# Patient Record
Sex: Female | Born: 1955 | Race: Black or African American | Hispanic: No | Marital: Married | Smoking: Never smoker
Health system: Southern US, Community
[De-identification: ages and names within clinical notes are randomized; demographics above are authoritative.]

## PROBLEM LIST (undated history)

## (undated) DIAGNOSIS — I10 Essential (primary) hypertension: Secondary | ICD-10-CM

## (undated) DIAGNOSIS — K219 Gastro-esophageal reflux disease without esophagitis: Secondary | ICD-10-CM

## (undated) DIAGNOSIS — E785 Hyperlipidemia, unspecified: Secondary | ICD-10-CM

## (undated) HISTORY — PX: OTHER SURGICAL HISTORY: SHX169

## (undated) HISTORY — PX: SPINE SURGERY: SHX786

## (undated) HISTORY — DX: Essential (primary) hypertension: I10

## (undated) HISTORY — DX: Hyperlipidemia, unspecified: E78.5

## (undated) HISTORY — PX: ABDOMINAL HYSTERECTOMY: SHX81

## (undated) HISTORY — DX: Gastro-esophageal reflux disease without esophagitis: K21.9

---

## 2011-05-13 ENCOUNTER — Other Ambulatory Visit (HOSPITAL_COMMUNITY): Payer: Self-pay | Admitting: *Deleted

## 2011-05-13 DIAGNOSIS — Z1231 Encounter for screening mammogram for malignant neoplasm of breast: Secondary | ICD-10-CM

## 2011-06-09 ENCOUNTER — Ambulatory Visit (HOSPITAL_COMMUNITY)
Admission: RE | Admit: 2011-06-09 | Discharge: 2011-06-09 | Disposition: A | Discharge status: Home / Self Care | Payer: Self-pay | Source: Ambulatory Visit | Attending: Internal Medicine | Admitting: Internal Medicine

## 2011-06-09 DIAGNOSIS — Z1231 Encounter for screening mammogram for malignant neoplasm of breast: Secondary | ICD-10-CM

## 2012-03-22 ENCOUNTER — Other Ambulatory Visit (HOSPITAL_COMMUNITY)
Admission: RE | Admit: 2012-03-22 | Discharge: 2012-03-22 | Disposition: A | Discharge status: Home / Self Care | Payer: BC Managed Care – PPO | Source: Ambulatory Visit | Attending: Family Medicine | Admitting: Family Medicine

## 2012-03-22 ENCOUNTER — Other Ambulatory Visit: Payer: Self-pay | Admitting: Family Medicine

## 2012-03-22 DIAGNOSIS — Z124 Encounter for screening for malignant neoplasm of cervix: Secondary | ICD-10-CM | POA: Insufficient documentation

## 2012-03-22 DIAGNOSIS — Z1151 Encounter for screening for human papillomavirus (HPV): Secondary | ICD-10-CM | POA: Insufficient documentation

## 2012-03-23 ENCOUNTER — Other Ambulatory Visit: Payer: Self-pay | Admitting: Family Medicine

## 2012-03-23 DIAGNOSIS — E049 Nontoxic goiter, unspecified: Secondary | ICD-10-CM

## 2012-04-02 ENCOUNTER — Ambulatory Visit
Admission: RE | Admit: 2012-04-02 | Discharge: 2012-04-02 | Disposition: A | Discharge status: Home / Self Care | Payer: BC Managed Care – PPO | Source: Ambulatory Visit | Attending: Family Medicine | Admitting: Family Medicine

## 2012-04-02 DIAGNOSIS — E049 Nontoxic goiter, unspecified: Secondary | ICD-10-CM

## 2012-04-19 ENCOUNTER — Encounter (INDEPENDENT_AMBULATORY_CARE_PROVIDER_SITE_OTHER): Payer: Self-pay | Admitting: Surgery

## 2012-04-23 ENCOUNTER — Encounter (INDEPENDENT_AMBULATORY_CARE_PROVIDER_SITE_OTHER): Payer: Self-pay | Admitting: Surgery

## 2012-04-23 ENCOUNTER — Ambulatory Visit (INDEPENDENT_AMBULATORY_CARE_PROVIDER_SITE_OTHER): Payer: BC Managed Care – PPO | Admitting: Surgery

## 2012-04-23 VITALS — BP 120/78 | HR 78 | Resp 18 | Ht 62.0 in | Wt 143.0 lb

## 2012-04-23 DIAGNOSIS — E042 Nontoxic multinodular goiter: Secondary | ICD-10-CM | POA: Insufficient documentation

## 2012-04-23 NOTE — Patient Instructions (Signed)
Thyroid Biopsy The thyroid gland is a butterfly-shaped gland situated in the front of the neck. It produces hormones which affect metabolism, growth and development, and body temperature. A thyroid biopsy is a procedure in which small samples of tissue or fluid are removed from the thyroid gland or mass and examined under a microscope. This test is done to determine the cause of thyroid problems, such as infection, cancer, or other thyroid problems. There are 2 ways to obtain samples: 1. Fine needle biopsy. Samples are removed using a thin needle inserted through the skin and into the thyroid gland or mass. 2. Open biopsy. Samples are removed after a cut (incision) is made through the skin. LET YOUR CAREGIVER KNOW ABOUT:   Allergies.  Medications taken including herbs, eye drops, over-the-counter medications, and creams.  Use of steroids (by mouth or creams).  Previous problems with anesthetics or numbing medicine.  Possibility of pregnancy, if this applies.  History of blood clots (thrombophlebitis).  History of bleeding or blood problems.  Previous surgery.  Other health problems. RISKS AND COMPLICATIONS  Bleeding from the site. The risk of bleeding is higher if you have a bleeding disorder or are taking any blood thinning medications (anticoagulants).  Infection.  Injury to structures near the thyroid gland. BEFORE THE PROCEDURE  This is a procedure that can be done as an outpatient. Confirm the time that you need to arrive for your procedure. Confirm whether there is a need to fast or withhold any medications. A blood sample may be done to determine your blood clotting time. Medicine may be given to help you relax (sedative). PROCEDURE Fine needle biopsy. You will be awake during the procedure. You may be asked to lie on your back with your head tipped backward to extend your neck. Let your caregiver know if you cannot tolerate the positioning. An area on your neck will be  cleansed. A needle is inserted through the skin of your neck. You may feel a mild discomfort during this procedure. You may be asked to avoid coughing, talking, swallowing, or making sounds during some portions of the procedure. The needle is withdrawn once tissue or fluid samples have been removed. Pressure may be applied to the neck to reduce swelling and ensure that bleeding has stopped. The samples will be sent for examination.  Open biopsy. You will be given general anesthesia. You will be asleep during the procedure. An incision is made in your neck. A sample of thyroid tissue or the mass is removed. The tissue sample or mass will be sent for examination. The sample or mass may be examined during the biopsy. If the sample or mass contains cancer cells, some or all of the thyroid gland may be removed. The incision is closed with stitches. AFTER THE PROCEDURE  Your recovery will be assessed and monitored. If there are no problems, as an outpatient, you should be able to go home shortly after the procedure. If you had a fine needle biopsy:  You may have soreness at the biopsy site for 1 to 2 days. If you had an open biopsy:   You may have soreness at the biopsy site for 3 to 4 days.  You may have a hoarse voice or sore throat for 1 to 2 days. Obtaining the Test Results It is your responsibility to obtain your test results. Do not assume everything is normal if you have not heard from your caregiver or the medical facility. It is important for you to follow up   on all of your test results. HOME CARE INSTRUCTIONS   Keeping your head raised on a pillow when you are lying down may ease biopsy site discomfort.  Supporting the back of your head and neck with both hands as you sit up from a lying position may ease biopsy site discomfort.  Only take over-the-counter or prescription medicines for pain, discomfort, or fever as directed by your caregiver.  Throat lozenges or gargling with warm salt  water may help to soothe a sore throat. SEEK IMMEDIATE MEDICAL CARE IF:   You have severe bleeding from the biopsy site.  You have difficulty swallowing.  You have a fever.  You have increased pain, swelling, redness, or warmth at the biopsy site.  You notice pus coming from the biopsy site.  You have swollen glands (lymph nodes) in your neck. Document Released: 11/14/2006 Document Revised: 04/11/2011 Document Reviewed: 04/16/2008 ExitCare Patient Information 2013 [employer].  

## 2012-04-23 NOTE — Progress Notes (Signed)
General Surgery [facility]  Chief Complaint  Patient presents with  . New Evaluation    Multinodular thyroid gland - referral from Dr. Mila Palmer    HISTORY: Patient is a 57 year old black female who presents on referral from her primary care physician with multinodular thyroid. This had been initially diagnosed in 2010 in [place]. At that time she underwent fine needle aspiration biopsy. Surgery was not recommended. Patient was placed on thyroid hormone supplements. Patient relocated to [facility] approximately 2 years ago. She stopped taking her thyroid hormone at that time.  Patient had thyroid ultrasound performed on 04/02/2012. This shows a normal sized thyroid gland containing multiple thyroid nodules. There is a dominant nodule in the left side of the thyroid isthmus measuring 3.1 cm in greatest dimension. It is heterogeneous. It may contain microcalcifications.  Patient denies any family history of other endocrine tumor such as pituitary tumors or adrenal tumors. She does have sisters with thyroid nodules. None of these individuals have had surgery. There is no family history of thyroid cancer.  Past Medical History  Diagnosis Date  . Hypertension   . Hyperlipidemia   . GERD (gastroesophageal reflux disease)      Current Outpatient Prescriptions  Medication Sig Dispense Refill  . amLODipine (NORVASC) 5 MG tablet Take 5 mg by mouth daily.      Marland Kitchen esomeprazole (NEXIUM) 40 MG capsule Take 40 mg by mouth daily before breakfast.      . losartan-hydrochlorothiazide (HYZAAR) 100-25 MG per tablet Take 1 tablet by mouth daily.      . simvastatin (ZOCOR) 20 MG tablet Take 20 mg by mouth every evening.       No current facility-administered medications for this visit.     Allergies  Allergen Reactions  . Penicillins Swelling     Family History  Problem Relation Age of Onset  . Heart disease Mother   . Stroke Father   . Hypertension Sister       History   Social History  . Marital Status: Married    Spouse Name: N/A    Number of Children: N/A  . Years of Education: N/A   Social History Main Topics  . Smoking status: Never Smoker   . Smokeless tobacco: None  . Alcohol Use: No  . Drug Use: No  . Sexually Active: None   Other Topics Concern  . None   Social History Narrative  . None     REVIEW OF SYSTEMS - PERTINENT POSITIVES ONLY: Denies tremor. Denies palpitations. Denies compressive symptoms. Denies pain.  EXAM: Filed Vitals:   04/23/12 1346  BP: 120/78  Pulse: 78  Resp: 18    HEENT: normocephalic; pupils equal and reactive; sclerae clear; dentition good; mucous membranes moist NECK:  Obvious dominant nodule just to the left of midline in the thyroid isthmus; palpation reveals approximately a 2.5 cm mobile nodule which is nontender; right and left lobes are without other dominant mass; asymmetric on extension; no palpable anterior or posterior cervical lymphadenopathy; no supraclavicular masses; no tenderness CHEST: clear to auscultation bilaterally without rales, rhonchi, or wheezes CARDIAC: regular rate and rhythm without significant murmur; peripheral pulses are full EXT:  non-tender without edema; no deformity NEURO: no gross focal deficits; no sign of tremor   LABORATORY RESULTS: See [facility] (CHL-Epic) for most recent results   RADIOLOGY RESULTS: See [facility] (CHL-Epic) for most recent results   IMPRESSION: #1 dominant thyroid nodule, 3.1 cm, arising in the isthmus #  2 multinodular thyroid gland #3 history of thyroid hormone supplementation  PLAN: The patient and I discussed the above findings at length. I provided her with written literature to review. I have recommended that she undergo a repeat fine-needle aspiration biopsy with ultrasound guidance. We will make arrangements for this study in the near future.  We will obtain a TSH level from her primary care physician if  one was obtained.  I will contact the patient with the results of her fine-needle aspiration biopsy once they are available. If this is indeed a benign finding, then we will make arrangements for 6 month followup thyroid ultrasound and TSH level and physical examination. If there is any evidence of malignancy, we will discuss thyroidectomy.  Velora Heckler, MD, FACS General & Endocrine Surgery [facility]   Visit Diagnoses: 1. Multiple thyroid nodules     Primary Care Physician: Emeterio Reeve, MD

## 2012-04-25 ENCOUNTER — Ambulatory Visit
Admission: RE | Admit: 2012-04-25 | Discharge: 2012-04-25 | Disposition: A | Discharge status: Home / Self Care | Payer: BC Managed Care – PPO | Source: Ambulatory Visit | Attending: Surgery | Admitting: Surgery

## 2012-04-25 ENCOUNTER — Other Ambulatory Visit (HOSPITAL_COMMUNITY)
Admission: RE | Admit: 2012-04-25 | Discharge: 2012-04-25 | Disposition: A | Discharge status: Home / Self Care | Payer: BC Managed Care – PPO | Source: Ambulatory Visit | Attending: Interventional Radiology | Admitting: Interventional Radiology

## 2012-04-25 DIAGNOSIS — E042 Nontoxic multinodular goiter: Secondary | ICD-10-CM

## 2012-04-25 DIAGNOSIS — E041 Nontoxic single thyroid nodule: Secondary | ICD-10-CM | POA: Insufficient documentation

## 2012-04-26 NOTE — Progress Notes (Signed)
Quick Note:  Please contact patient and notify of benign pathology results.  Augusta Mirkin M. Advith Martine, MD, FACS [facility] Office: [phone]   ______ 

## 2012-05-09 ENCOUNTER — Telehealth (INDEPENDENT_AMBULATORY_CARE_PROVIDER_SITE_OTHER): Payer: Self-pay

## 2012-05-09 NOTE — Telephone Encounter (Signed)
Labs here and to Dr Gerrit Friends to review.

## 2012-05-17 ENCOUNTER — Telehealth (INDEPENDENT_AMBULATORY_CARE_PROVIDER_SITE_OTHER): Payer: Self-pay

## 2012-05-17 NOTE — Telephone Encounter (Signed)
Message copied by Joanette Gula on Thu May 17, 2012  2:06 PM ------      Message from: Parks Neptune      Created: Thu May 17, 2012  1:37 PM      Regarding: Synthroid Meds      Contact: [phone]       Will I need the synthroid meds or not?  DF ------

## 2012-05-24 ENCOUNTER — Encounter (INDEPENDENT_AMBULATORY_CARE_PROVIDER_SITE_OTHER): Payer: Self-pay

## 2012-05-25 ENCOUNTER — Encounter (INDEPENDENT_AMBULATORY_CARE_PROVIDER_SITE_OTHER): Payer: Self-pay

## 2012-07-13 ENCOUNTER — Other Ambulatory Visit (HOSPITAL_COMMUNITY): Payer: Self-pay | Admitting: Family Medicine

## 2012-07-13 DIAGNOSIS — Z1231 Encounter for screening mammogram for malignant neoplasm of breast: Secondary | ICD-10-CM

## 2012-07-19 ENCOUNTER — Ambulatory Visit (HOSPITAL_COMMUNITY)
Admission: RE | Admit: 2012-07-19 | Discharge: 2012-07-19 | Disposition: A | Discharge status: Home / Self Care | Payer: BC Managed Care – PPO | Source: Ambulatory Visit | Attending: Family Medicine | Admitting: Family Medicine

## 2012-07-19 DIAGNOSIS — Z1231 Encounter for screening mammogram for malignant neoplasm of breast: Secondary | ICD-10-CM | POA: Insufficient documentation

## 2012-07-24 ENCOUNTER — Other Ambulatory Visit: Payer: Self-pay | Admitting: Family Medicine

## 2012-07-24 DIAGNOSIS — R928 Other abnormal and inconclusive findings on diagnostic imaging of breast: Secondary | ICD-10-CM

## 2012-08-07 ENCOUNTER — Ambulatory Visit
Admission: RE | Admit: 2012-08-07 | Discharge: 2012-08-07 | Disposition: A | Discharge status: Home / Self Care | Payer: BC Managed Care – PPO | Source: Ambulatory Visit | Attending: Family Medicine | Admitting: Family Medicine

## 2012-08-07 DIAGNOSIS — R928 Other abnormal and inconclusive findings on diagnostic imaging of breast: Secondary | ICD-10-CM

## 2012-11-12 ENCOUNTER — Encounter (INDEPENDENT_AMBULATORY_CARE_PROVIDER_SITE_OTHER): Payer: Self-pay | Admitting: Surgery

## 2012-11-12 ENCOUNTER — Ambulatory Visit (INDEPENDENT_AMBULATORY_CARE_PROVIDER_SITE_OTHER): Payer: BC Managed Care – PPO | Admitting: Surgery

## 2012-11-12 VITALS — BP 122/82 | HR 72 | Temp 98.2°F | Resp 14 | Ht 61.25 in | Wt 144.6 lb

## 2012-11-12 DIAGNOSIS — E042 Nontoxic multinodular goiter: Secondary | ICD-10-CM

## 2012-11-12 DIAGNOSIS — E041 Nontoxic single thyroid nodule: Secondary | ICD-10-CM

## 2012-11-12 NOTE — Progress Notes (Signed)
General Surgery [facility]  Chief Complaint  Patient presents with  . Follow-up    thyroid nodules    HISTORY: Patient is a 57 year old female followed for multiple thyroid nodules. She was last evaluated in March 2014. She has a dominant nodule measuring 3.1 cm in the thyroid isthmus. Fine needle aspiration biopsy showed benign follicular cells. TSH level in January 2014 was normal at 0.65. Patient is not on thyroid hormone.  PERTINENT REVIEW OF SYSTEMS: Patient denies tremor. Denies palpitations. Denies compressive symptoms. Patient does not drink dominant nodule has changed in size since her last examination.  EXAM: HEENT: normocephalic; pupils equal and reactive; sclerae clear; dentition good; mucous membranes moist NECK:  Palpable 3 cm soft mobile mass in the thyroid isthmus; left and right thyroid lobes without palpable dominant nodules; asymmetric on extension; no palpable anterior or posterior cervical lymphadenopathy; no supraclavicular masses; no tenderness CHEST: clear to auscultation bilaterally without rales, rhonchi, or wheezes CARDIAC: regular rate and rhythm without significant murmur; peripheral pulses are full EXT:  non-tender without edema; no deformity NEURO: no gross focal deficits; no sign of tremor   IMPRESSION: Multiple thyroid nodules, benign cytopathology, clinically stable  PLAN: Patient and I discussed her biopsy result and her TSH level. We will repeat her thyroid ultrasound in March 2015. She will have a TSH level drawn by her primary care physician at her annual visit. I plan to see her back for physical examination in April 2015.  Velora Heckler, MD, [facility] Office: [phone]  Visit Diagnoses: 1. Thyroid nodule   2. Multiple thyroid nodules

## 2012-11-14 ENCOUNTER — Ambulatory Visit (INDEPENDENT_AMBULATORY_CARE_PROVIDER_SITE_OTHER): Payer: BC Managed Care – PPO | Admitting: Surgery

## 2013-04-08 ENCOUNTER — Ambulatory Visit
Admission: RE | Admit: 2013-04-08 | Discharge: 2013-04-08 | Disposition: A | Discharge status: Home / Self Care | Payer: 59 | Source: Ambulatory Visit | Attending: Surgery | Admitting: Surgery

## 2013-04-08 DIAGNOSIS — E041 Nontoxic single thyroid nodule: Secondary | ICD-10-CM

## 2013-04-09 ENCOUNTER — Telehealth (INDEPENDENT_AMBULATORY_CARE_PROVIDER_SITE_OTHER): Payer: Self-pay

## 2013-04-09 NOTE — Telephone Encounter (Signed)
Recent U/S in epic. Will send msg to Dr Gerrit FriendsGerkin to review and advise f/u.

## 2013-04-16 NOTE — Telephone Encounter (Signed)
Discussed ultrasound results with patient.  Will cancel April appointment.  Plan repeat ultrasound and TSH level in one year.  Will see in office after those studies.  Velora Hecklerodd M. Syriah Delisi, MD, [facility] Office: [phone]

## 2013-05-20 ENCOUNTER — Ambulatory Visit (INDEPENDENT_AMBULATORY_CARE_PROVIDER_SITE_OTHER): Payer: BC Managed Care – PPO | Admitting: Surgery

## 2013-07-25 ENCOUNTER — Other Ambulatory Visit: Payer: Self-pay

## 2013-07-25 DIAGNOSIS — Z1231 Encounter for screening mammogram for malignant neoplasm of breast: Secondary | ICD-10-CM

## 2013-08-05 ENCOUNTER — Ambulatory Visit: Payer: 59

## 2013-09-02 ENCOUNTER — Ambulatory Visit: Payer: 59

## 2013-09-23 ENCOUNTER — Ambulatory Visit: Payer: 59

## 2013-10-21 ENCOUNTER — Ambulatory Visit: Payer: 59

## 2013-11-18 ENCOUNTER — Ambulatory Visit: Payer: 59

## 2014-01-01 ENCOUNTER — Ambulatory Visit
Admission: RE | Admit: 2014-01-01 | Discharge: 2014-01-01 | Disposition: A | Discharge status: Home / Self Care | Payer: 59 | Source: Ambulatory Visit

## 2014-01-01 DIAGNOSIS — Z1231 Encounter for screening mammogram for malignant neoplasm of breast: Secondary | ICD-10-CM

## 2014-03-10 ENCOUNTER — Other Ambulatory Visit (INDEPENDENT_AMBULATORY_CARE_PROVIDER_SITE_OTHER): Payer: Self-pay | Admitting: Surgery

## 2014-03-17 ENCOUNTER — Other Ambulatory Visit (INDEPENDENT_AMBULATORY_CARE_PROVIDER_SITE_OTHER): Payer: Self-pay | Admitting: *Deleted

## 2014-03-17 DIAGNOSIS — E042 Nontoxic multinodular goiter: Secondary | ICD-10-CM

## 2014-03-19 ENCOUNTER — Ambulatory Visit
Admission: RE | Admit: 2014-03-19 | Discharge: 2014-03-19 | Disposition: A | Discharge status: Home / Self Care | Payer: 59 | Source: Ambulatory Visit | Attending: Surgery | Admitting: Surgery

## 2014-08-03 IMAGING — US US SOFT TISSUE HEAD/NECK
1 series · 13 of 25 positions shown · non-contrast
Comparison: The patient describes an ultrasound in [place] [facility] form
several years ago.  The reports are not available.

CLINICAL DATA: Thyroid goiter

THYROID ULTRASOUND
TECHNIQUE: Ultrasound examination of the thyroid gland and adjacent
soft tissues was performed.

[Series 1: us soft tissue head/neck · 0.09mm/px · 13 of 68 slices shown]
[im 1/68]
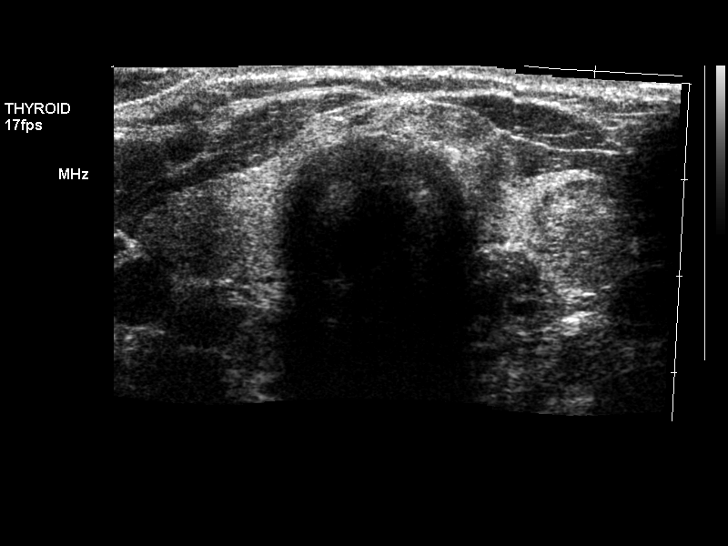
[im 6/68]
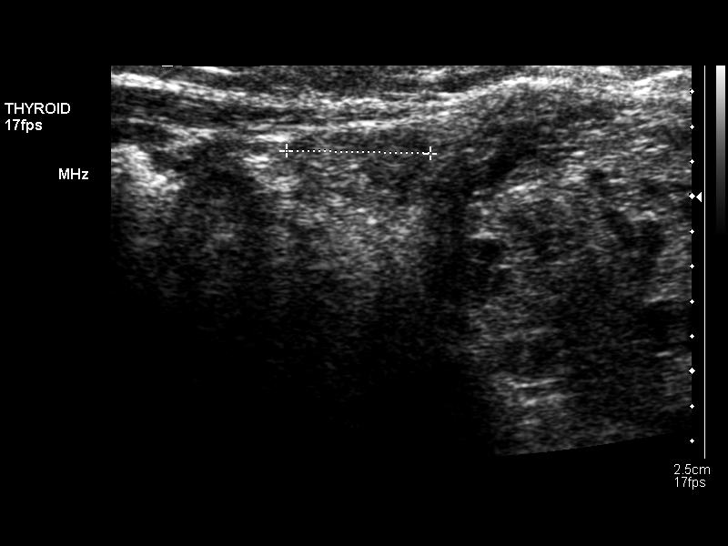
[im 12/68]
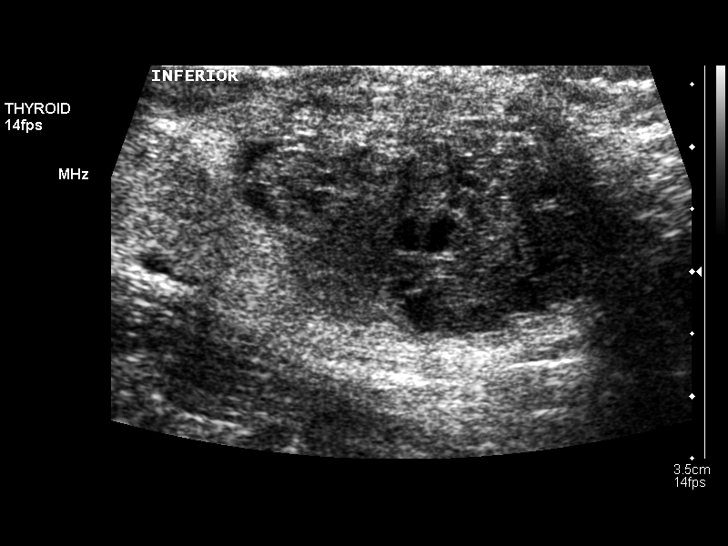
[im 17/68]
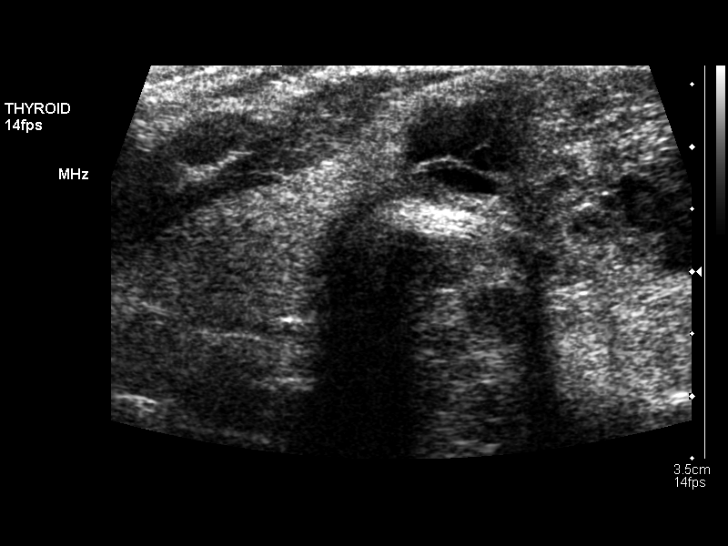
[im 23/68]
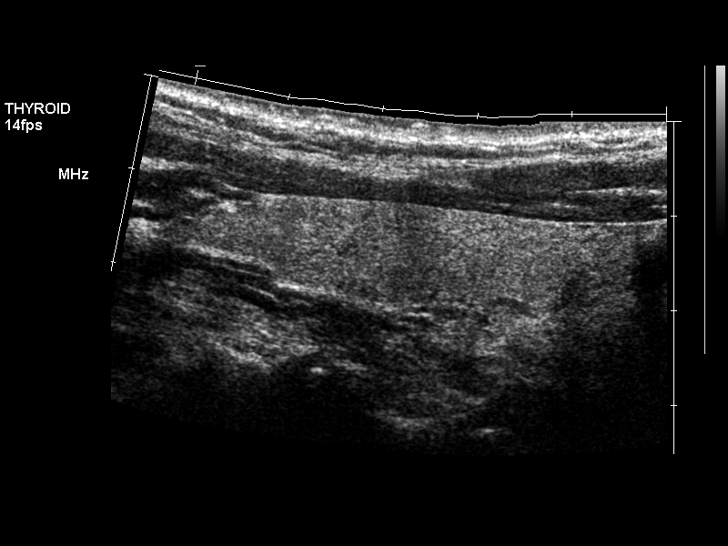
[im 28/68]
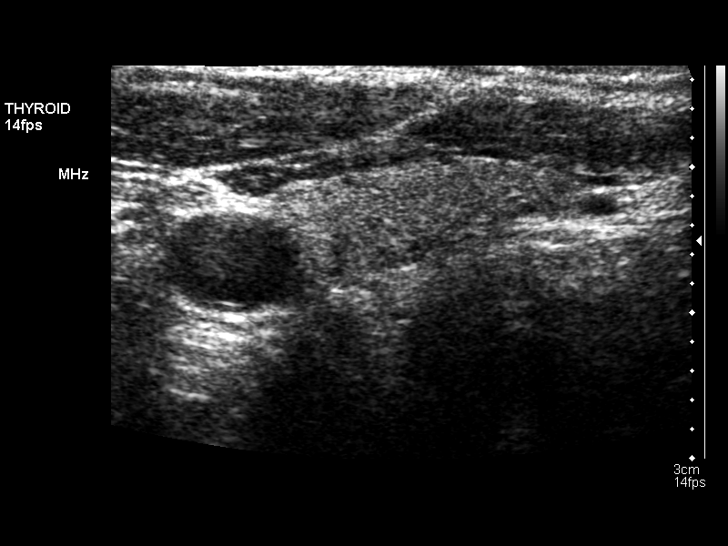
[im 34/68]
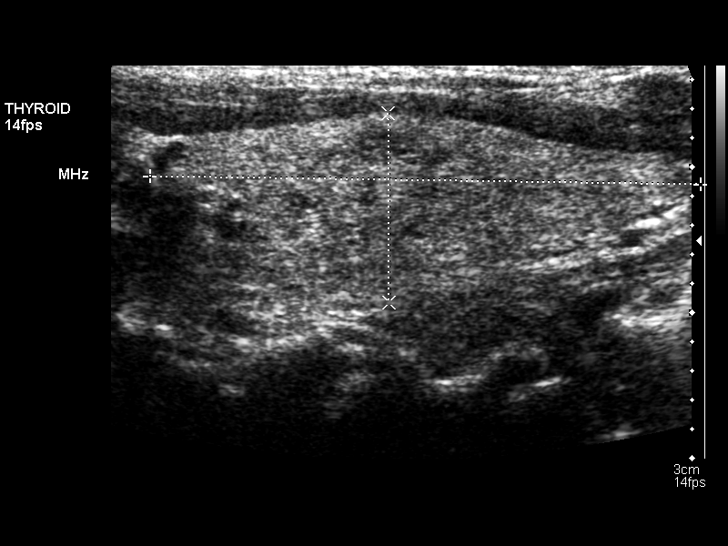
[im 40/68]
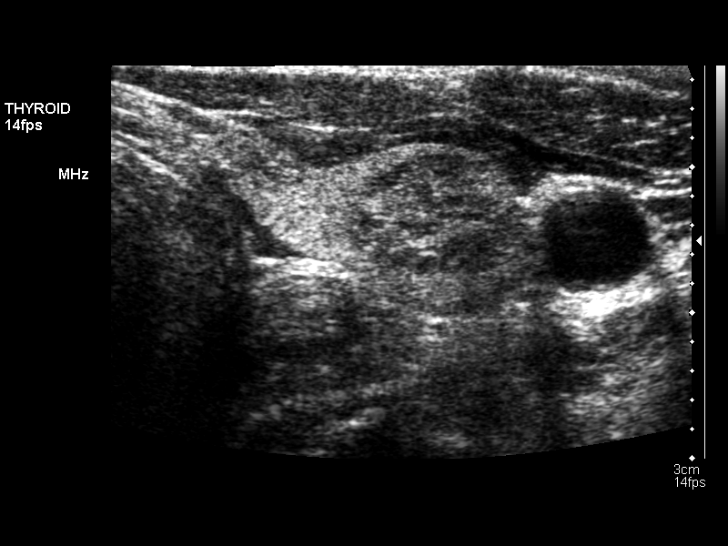
[im 45/68]
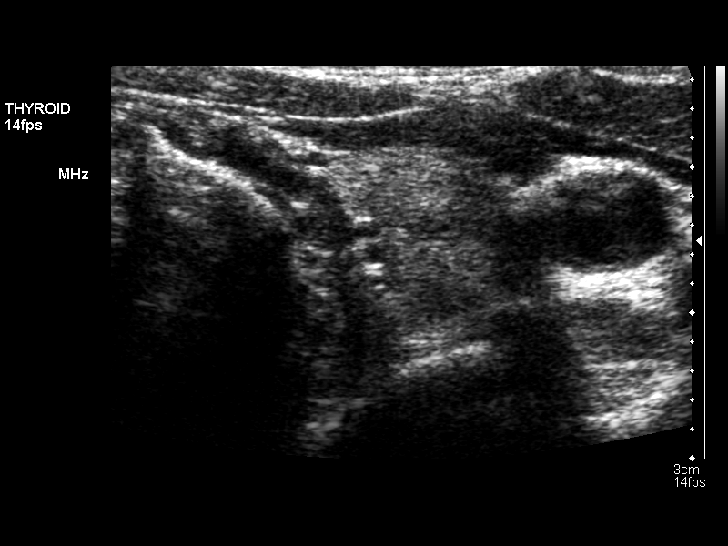
[im 51/68]
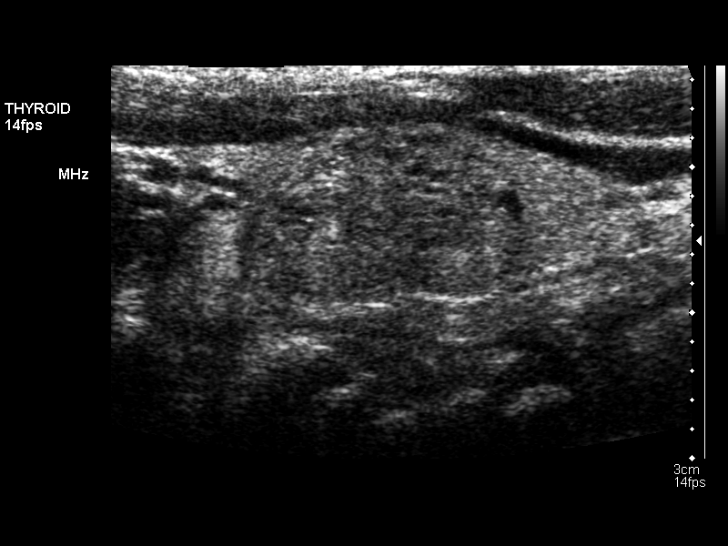
[im 56/68]
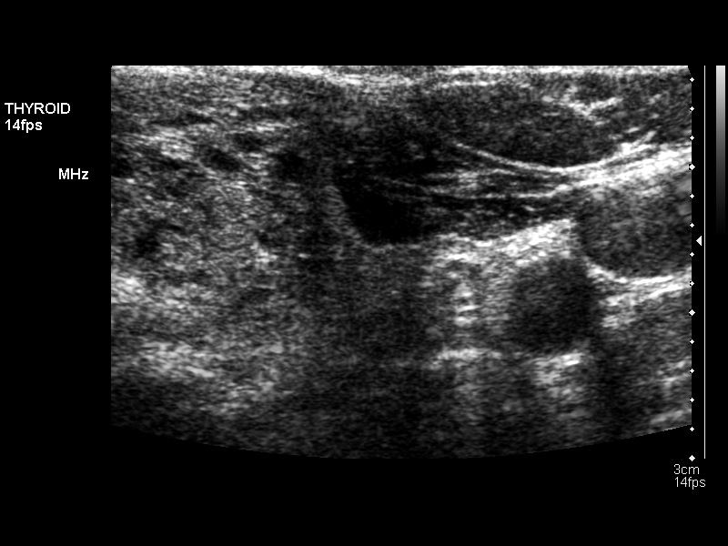
[im 62/68]
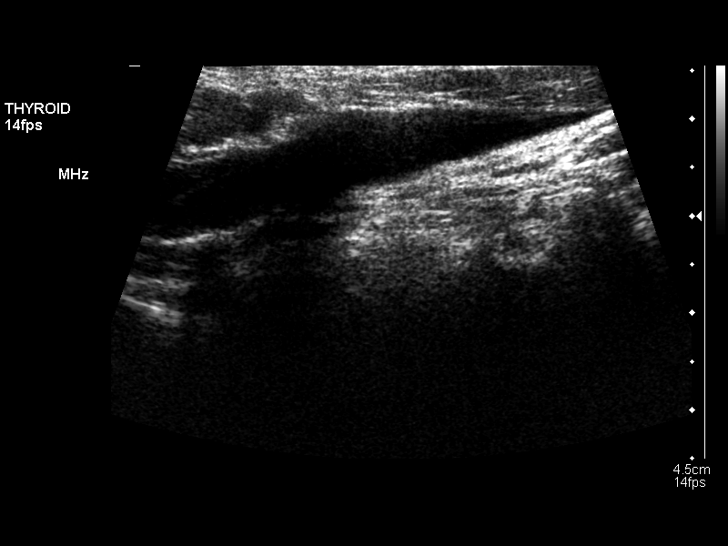
[im 68/68]
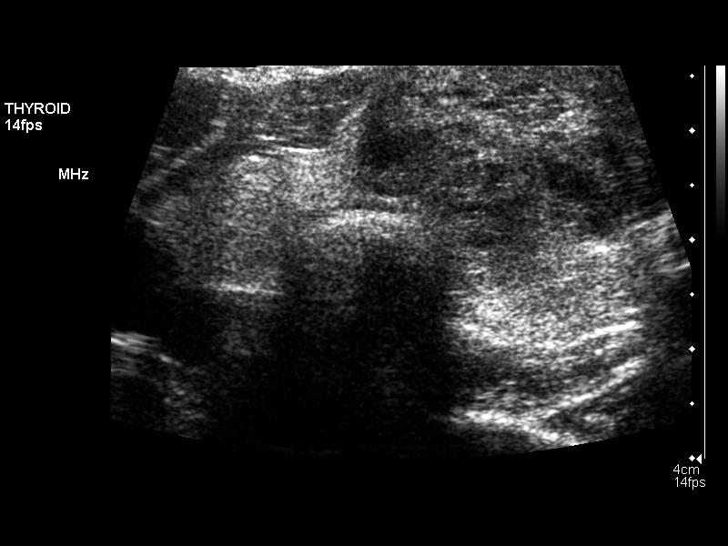

[13 of 25 positions shown; findings below may reference images not displayed]

FINDINGS: Right thyroid lobe:  4.8 x 1.2 x 1.4 cm.
Left thyroid lobe:  3.8 x 1.3 x 2.0 cm.
Isthmus:  4.4 mm

Focal nodules:  Several nodules are present.  The largest is along
the left side of the isthmus, measuring 2.7 x 2.2 x 3.1 cm.  This
is a solid nodule with significant heterogeneity.  Some
microcalcifications are present.

A smaller nodule on the right side of the isthmus measures 1.0 x
0.8 x 0.7 cm.

Four nodules in the left lobe of the thyroid range from 0.5 cm to
1.5 cm.

Lymphadenopathy:  A benign-appearing lymph node in the left neck
measures 3.5 mm in short axis.  No pathologic nodes are evident.
IMPRESSION: Multinodular goiter.  The largest nodule is within the isthmus,
measuring 2.7 x 2.2 x 3.1 cm.  This may contain some internal
calcifications. Findings meet consensus criteria for biopsy.
Ultrasound-guided fine needle aspiration should be considered, as
per the consensus statement: Management of Thyroid Nodules Detected
at US:  Society of Radiologists in Ultrasound Consensus Conference

## 2014-11-27 ENCOUNTER — Other Ambulatory Visit: Payer: Self-pay

## 2014-11-27 DIAGNOSIS — Z1231 Encounter for screening mammogram for malignant neoplasm of breast: Secondary | ICD-10-CM

## 2015-01-09 ENCOUNTER — Ambulatory Visit: Payer: 59

## 2015-02-17 ENCOUNTER — Other Ambulatory Visit: Payer: Self-pay | Admitting: Surgery

## 2015-02-17 DIAGNOSIS — E042 Nontoxic multinodular goiter: Secondary | ICD-10-CM

## 2015-03-13 ENCOUNTER — Ambulatory Visit
Admission: RE | Admit: 2015-03-13 | Discharge: 2015-03-13 | Disposition: A | Discharge status: Home / Self Care | Payer: 59 | Source: Ambulatory Visit

## 2015-03-13 DIAGNOSIS — Z1231 Encounter for screening mammogram for malignant neoplasm of breast: Secondary | ICD-10-CM

## 2015-05-26 ENCOUNTER — Ambulatory Visit
Admission: RE | Admit: 2015-05-26 | Discharge: 2015-05-26 | Disposition: A | Discharge status: Home / Self Care | Payer: 59 | Source: Ambulatory Visit | Attending: Surgery | Admitting: Surgery

## 2015-05-26 DIAGNOSIS — E042 Nontoxic multinodular goiter: Secondary | ICD-10-CM

## 2016-04-06 ENCOUNTER — Other Ambulatory Visit: Payer: Self-pay | Admitting: Surgery

## 2016-04-06 DIAGNOSIS — E041 Nontoxic single thyroid nodule: Secondary | ICD-10-CM

## 2016-04-21 ENCOUNTER — Ambulatory Visit
Admission: RE | Admit: 2016-04-21 | Discharge: 2016-04-21 | Disposition: A | Discharge status: Home / Self Care | Payer: 59 | Source: Ambulatory Visit | Attending: Surgery | Admitting: Surgery

## 2016-04-21 DIAGNOSIS — E041 Nontoxic single thyroid nodule: Secondary | ICD-10-CM

## 2017-04-12 ENCOUNTER — Other Ambulatory Visit: Payer: Self-pay | Admitting: Surgery

## 2017-04-12 DIAGNOSIS — E042 Nontoxic multinodular goiter: Secondary | ICD-10-CM

## 2017-05-22 ENCOUNTER — Other Ambulatory Visit: Payer: Self-pay | Admitting: Family Medicine

## 2017-05-22 DIAGNOSIS — Z139 Encounter for screening, unspecified: Secondary | ICD-10-CM

## 2017-06-12 ENCOUNTER — Ambulatory Visit
Admission: RE | Admit: 2017-06-12 | Discharge: 2017-06-12 | Disposition: A | Discharge status: Home / Self Care | Payer: 59 | Source: Ambulatory Visit | Attending: Surgery | Admitting: Surgery

## 2017-06-12 DIAGNOSIS — E042 Nontoxic multinodular goiter: Secondary | ICD-10-CM

## 2017-06-13 ENCOUNTER — Ambulatory Visit
Admission: RE | Admit: 2017-06-13 | Discharge: 2017-06-13 | Disposition: A | Discharge status: Home / Self Care | Payer: 59 | Source: Ambulatory Visit | Attending: Family Medicine | Admitting: Family Medicine

## 2017-06-13 DIAGNOSIS — Z139 Encounter for screening, unspecified: Secondary | ICD-10-CM

## 2017-12-11 DIAGNOSIS — E876 Hypokalemia: Secondary | ICD-10-CM | POA: Diagnosis not present

## 2018-01-08 DIAGNOSIS — M545 Low back pain: Secondary | ICD-10-CM | POA: Diagnosis not present

## 2018-01-08 DIAGNOSIS — M7501 Adhesive capsulitis of right shoulder: Secondary | ICD-10-CM | POA: Diagnosis not present

## 2018-01-09 DIAGNOSIS — E876 Hypokalemia: Secondary | ICD-10-CM | POA: Diagnosis not present

## 2018-02-09 DIAGNOSIS — R69 Illness, unspecified: Secondary | ICD-10-CM | POA: Diagnosis not present

## 2018-02-09 DIAGNOSIS — R509 Fever, unspecified: Secondary | ICD-10-CM | POA: Diagnosis not present

## 2018-02-26 DIAGNOSIS — E7800 Pure hypercholesterolemia, unspecified: Secondary | ICD-10-CM | POA: Diagnosis not present

## 2018-02-26 DIAGNOSIS — I10 Essential (primary) hypertension: Secondary | ICD-10-CM | POA: Diagnosis not present

## 2018-02-26 DIAGNOSIS — E049 Nontoxic goiter, unspecified: Secondary | ICD-10-CM | POA: Diagnosis not present

## 2018-02-26 DIAGNOSIS — R222 Localized swelling, mass and lump, trunk: Secondary | ICD-10-CM | POA: Diagnosis not present

## 2018-03-01 ENCOUNTER — Other Ambulatory Visit: Payer: Self-pay | Admitting: Family Medicine

## 2018-03-01 DIAGNOSIS — R222 Localized swelling, mass and lump, trunk: Secondary | ICD-10-CM

## 2018-03-06 ENCOUNTER — Ambulatory Visit
Admission: RE | Admit: 2018-03-06 | Discharge: 2018-03-06 | Disposition: A | Discharge status: Home / Self Care | Payer: Self-pay | Source: Ambulatory Visit | Attending: Family Medicine | Admitting: Family Medicine

## 2018-03-06 DIAGNOSIS — R222 Localized swelling, mass and lump, trunk: Secondary | ICD-10-CM

## 2018-03-06 DIAGNOSIS — R221 Localized swelling, mass and lump, neck: Secondary | ICD-10-CM | POA: Diagnosis not present

## 2018-03-12 DIAGNOSIS — E049 Nontoxic goiter, unspecified: Secondary | ICD-10-CM | POA: Diagnosis not present

## 2018-03-12 DIAGNOSIS — E7800 Pure hypercholesterolemia, unspecified: Secondary | ICD-10-CM | POA: Diagnosis not present

## 2018-03-12 DIAGNOSIS — Z79899 Other long term (current) drug therapy: Secondary | ICD-10-CM | POA: Diagnosis not present

## 2018-05-28 ENCOUNTER — Other Ambulatory Visit: Payer: Self-pay | Admitting: Family Medicine

## 2018-05-28 DIAGNOSIS — Z1231 Encounter for screening mammogram for malignant neoplasm of breast: Secondary | ICD-10-CM

## 2018-06-08 ENCOUNTER — Other Ambulatory Visit: Payer: Self-pay | Admitting: Surgery

## 2018-06-08 DIAGNOSIS — E042 Nontoxic multinodular goiter: Secondary | ICD-10-CM

## 2018-06-11 DIAGNOSIS — Z1211 Encounter for screening for malignant neoplasm of colon: Secondary | ICD-10-CM | POA: Diagnosis not present

## 2018-07-04 ENCOUNTER — Other Ambulatory Visit: Payer: Self-pay

## 2018-07-04 ENCOUNTER — Ambulatory Visit
Admission: RE | Admit: 2018-07-04 | Discharge: 2018-07-04 | Disposition: A | Discharge status: Home / Self Care | Payer: BLUE CROSS/BLUE SHIELD | Source: Ambulatory Visit | Attending: Surgery | Admitting: Surgery

## 2018-07-04 DIAGNOSIS — E042 Nontoxic multinodular goiter: Secondary | ICD-10-CM

## 2018-07-04 DIAGNOSIS — E041 Nontoxic single thyroid nodule: Secondary | ICD-10-CM | POA: Diagnosis not present

## 2018-07-10 DIAGNOSIS — E042 Nontoxic multinodular goiter: Secondary | ICD-10-CM | POA: Diagnosis not present

## 2018-07-23 ENCOUNTER — Ambulatory Visit: Payer: BLUE CROSS/BLUE SHIELD

## 2018-09-03 ENCOUNTER — Other Ambulatory Visit: Payer: Self-pay

## 2018-09-03 ENCOUNTER — Ambulatory Visit
Admission: RE | Admit: 2018-09-03 | Discharge: 2018-09-03 | Disposition: A | Discharge status: Home / Self Care | Payer: BC Managed Care – PPO | Source: Ambulatory Visit | Attending: Family Medicine | Admitting: Family Medicine

## 2018-09-03 DIAGNOSIS — Z1231 Encounter for screening mammogram for malignant neoplasm of breast: Secondary | ICD-10-CM | POA: Diagnosis not present

## 2018-10-05 ENCOUNTER — Other Ambulatory Visit: Payer: Self-pay

## 2018-10-05 DIAGNOSIS — Z20822 Contact with and (suspected) exposure to covid-19: Secondary | ICD-10-CM

## 2018-10-07 LAB — NOVEL CORONAVIRUS, NAA: SARS-CoV-2, NAA: NOT DETECTED

## 2019-02-25 DIAGNOSIS — Z1211 Encounter for screening for malignant neoplasm of colon: Secondary | ICD-10-CM | POA: Diagnosis not present

## 2019-02-25 DIAGNOSIS — K219 Gastro-esophageal reflux disease without esophagitis: Secondary | ICD-10-CM | POA: Diagnosis not present

## 2019-02-25 DIAGNOSIS — I10 Essential (primary) hypertension: Secondary | ICD-10-CM | POA: Diagnosis not present

## 2019-02-25 DIAGNOSIS — E559 Vitamin D deficiency, unspecified: Secondary | ICD-10-CM | POA: Diagnosis not present

## 2019-02-25 DIAGNOSIS — Z79899 Other long term (current) drug therapy: Secondary | ICD-10-CM | POA: Diagnosis not present

## 2019-02-25 DIAGNOSIS — Z0000 Encounter for general adult medical examination without abnormal findings: Secondary | ICD-10-CM | POA: Diagnosis not present

## 2019-02-25 DIAGNOSIS — Z8639 Personal history of other endocrine, nutritional and metabolic disease: Secondary | ICD-10-CM | POA: Diagnosis not present

## 2019-02-25 DIAGNOSIS — E049 Nontoxic goiter, unspecified: Secondary | ICD-10-CM | POA: Diagnosis not present

## 2019-02-25 DIAGNOSIS — F330 Major depressive disorder, recurrent, mild: Secondary | ICD-10-CM | POA: Diagnosis not present

## 2019-02-25 DIAGNOSIS — E7800 Pure hypercholesterolemia, unspecified: Secondary | ICD-10-CM | POA: Diagnosis not present

## 2019-02-25 DIAGNOSIS — Z23 Encounter for immunization: Secondary | ICD-10-CM | POA: Diagnosis not present

## 2019-03-29 ENCOUNTER — Ambulatory Visit: Payer: Self-pay

## 2019-04-01 ENCOUNTER — Ambulatory Visit: Payer: PPO | Attending: Internal Medicine

## 2019-04-01 DIAGNOSIS — Z23 Encounter for immunization: Secondary | ICD-10-CM | POA: Insufficient documentation

## 2019-04-01 NOTE — Progress Notes (Signed)
   Covid-19 Vaccination Clinic  Name:  ATLANTA PELTO    MRN: [id number] DOB: 1955-09-04  04/01/2019  Ms. Marich was observed post Covid-19 immunization for 15 minutes without incidence. She was provided with Vaccine Information Sheet and instruction to access the V-Safe system.   Ms. Yamin was instructed to call 911 with any severe reactions post vaccine: Marland Kitchen Difficulty breathing  . Swelling of your face and throat  . A fast heartbeat  . A bad rash all over your body  . Dizziness and weakness    Immunizations Administered    Name Date Dose VIS Date Route   Pfizer COVID-19 Vaccine 04/01/2019 10:07 AM 0.3 mL 01/11/2019 Intramuscular   Manufacturer: [employer], [employer]   Lot: [id number]   NDC: [address]-3

## 2019-04-02 DIAGNOSIS — R232 Flushing: Secondary | ICD-10-CM | POA: Diagnosis not present

## 2019-04-26 DIAGNOSIS — K625 Hemorrhage of anus and rectum: Secondary | ICD-10-CM | POA: Diagnosis not present

## 2019-04-30 ENCOUNTER — Ambulatory Visit: Payer: PPO | Attending: Internal Medicine

## 2019-04-30 DIAGNOSIS — Z23 Encounter for immunization: Secondary | ICD-10-CM

## 2019-04-30 NOTE — Progress Notes (Signed)
   Covid-19 Vaccination Clinic  Name:  Shelby Woodward    MRN: [id number] DOB: 16-Mar-1955  04/30/2019  Ms. Shelby Woodward was observed post Covid-19 immunization for 15 minutes without incident. She was provided with Vaccine Information Sheet and instruction to access the V-Safe system.   Ms. Shelby Woodward was instructed to call 911 with any severe reactions post vaccine: Marland Kitchen Difficulty breathing  . Swelling of face and throat  . A fast heartbeat  . A bad rash all over body  . Dizziness and weakness   Immunizations Administered    Name Date Dose VIS Date Route   Pfizer COVID-19 Vaccine 04/30/2019 10:47 AM 0.3 mL 01/11/2019 Intramuscular   Manufacturer: [employer], [employer]   Lot: [id number]   NDC: [address]-3

## 2019-06-04 ENCOUNTER — Other Ambulatory Visit: Payer: Self-pay | Admitting: Surgery

## 2019-06-04 DIAGNOSIS — E042 Nontoxic multinodular goiter: Secondary | ICD-10-CM

## 2019-06-10 ENCOUNTER — Other Ambulatory Visit: Payer: Self-pay | Admitting: Family Medicine

## 2019-06-10 DIAGNOSIS — E2839 Other primary ovarian failure: Secondary | ICD-10-CM

## 2019-06-12 DIAGNOSIS — Z1211 Encounter for screening for malignant neoplasm of colon: Secondary | ICD-10-CM | POA: Diagnosis not present

## 2019-06-24 ENCOUNTER — Ambulatory Visit
Admission: RE | Admit: 2019-06-24 | Discharge: 2019-06-24 | Disposition: A | Discharge status: Home / Self Care | Payer: PPO | Source: Ambulatory Visit | Attending: Surgery | Admitting: Surgery

## 2019-06-24 DIAGNOSIS — E042 Nontoxic multinodular goiter: Secondary | ICD-10-CM

## 2019-06-24 DIAGNOSIS — E041 Nontoxic single thyroid nodule: Secondary | ICD-10-CM | POA: Diagnosis not present

## 2019-07-25 DIAGNOSIS — S83242A Other tear of medial meniscus, current injury, left knee, initial encounter: Secondary | ICD-10-CM | POA: Diagnosis not present

## 2019-07-25 DIAGNOSIS — M7502 Adhesive capsulitis of left shoulder: Secondary | ICD-10-CM | POA: Diagnosis not present

## 2019-08-14 ENCOUNTER — Other Ambulatory Visit: Payer: Self-pay | Admitting: Family Medicine

## 2019-08-14 DIAGNOSIS — Z1231 Encounter for screening mammogram for malignant neoplasm of breast: Secondary | ICD-10-CM

## 2019-08-23 ENCOUNTER — Other Ambulatory Visit: Payer: PPO

## 2019-10-01 ENCOUNTER — Ambulatory Visit: Payer: PPO

## 2019-10-08 DIAGNOSIS — E785 Hyperlipidemia, unspecified: Secondary | ICD-10-CM | POA: Diagnosis not present

## 2019-10-08 DIAGNOSIS — F4329 Adjustment disorder with other symptoms: Secondary | ICD-10-CM | POA: Diagnosis not present

## 2019-10-08 DIAGNOSIS — Z23 Encounter for immunization: Secondary | ICD-10-CM | POA: Diagnosis not present

## 2019-10-29 ENCOUNTER — Ambulatory Visit
Admission: RE | Admit: 2019-10-29 | Discharge: 2019-10-29 | Disposition: A | Discharge status: Home / Self Care | Payer: PPO | Source: Ambulatory Visit | Attending: Family Medicine | Admitting: Family Medicine

## 2019-10-29 ENCOUNTER — Other Ambulatory Visit: Payer: Self-pay

## 2019-10-29 DIAGNOSIS — Z1231 Encounter for screening mammogram for malignant neoplasm of breast: Secondary | ICD-10-CM

## 2019-11-01 DIAGNOSIS — E785 Hyperlipidemia, unspecified: Secondary | ICD-10-CM | POA: Diagnosis not present

## 2019-11-08 ENCOUNTER — Other Ambulatory Visit: Payer: Self-pay

## 2019-11-08 ENCOUNTER — Ambulatory Visit
Admission: RE | Admit: 2019-11-08 | Discharge: 2019-11-08 | Disposition: A | Discharge status: Home / Self Care | Payer: PPO | Source: Ambulatory Visit | Attending: Family Medicine | Admitting: Family Medicine

## 2019-11-08 DIAGNOSIS — Z780 Asymptomatic menopausal state: Secondary | ICD-10-CM | POA: Diagnosis not present

## 2019-11-08 DIAGNOSIS — M8589 Other specified disorders of bone density and structure, multiple sites: Secondary | ICD-10-CM | POA: Diagnosis not present

## 2019-11-08 DIAGNOSIS — E2839 Other primary ovarian failure: Secondary | ICD-10-CM

## 2020-03-06 DIAGNOSIS — F330 Major depressive disorder, recurrent, mild: Secondary | ICD-10-CM | POA: Diagnosis not present

## 2020-03-06 DIAGNOSIS — E785 Hyperlipidemia, unspecified: Secondary | ICD-10-CM | POA: Diagnosis not present

## 2020-03-06 DIAGNOSIS — Z8639 Personal history of other endocrine, nutritional and metabolic disease: Secondary | ICD-10-CM | POA: Diagnosis not present

## 2020-03-06 DIAGNOSIS — E876 Hypokalemia: Secondary | ICD-10-CM | POA: Diagnosis not present

## 2020-03-06 DIAGNOSIS — K219 Gastro-esophageal reflux disease without esophagitis: Secondary | ICD-10-CM | POA: Diagnosis not present

## 2020-03-06 DIAGNOSIS — I10 Essential (primary) hypertension: Secondary | ICD-10-CM | POA: Diagnosis not present

## 2020-03-06 DIAGNOSIS — E049 Nontoxic goiter, unspecified: Secondary | ICD-10-CM | POA: Diagnosis not present

## 2020-03-06 DIAGNOSIS — Z1211 Encounter for screening for malignant neoplasm of colon: Secondary | ICD-10-CM | POA: Diagnosis not present

## 2020-03-06 DIAGNOSIS — Z23 Encounter for immunization: Secondary | ICD-10-CM | POA: Diagnosis not present

## 2020-03-06 DIAGNOSIS — Z0000 Encounter for general adult medical examination without abnormal findings: Secondary | ICD-10-CM | POA: Diagnosis not present

## 2020-03-06 DIAGNOSIS — Z79899 Other long term (current) drug therapy: Secondary | ICD-10-CM | POA: Diagnosis not present

## 2020-03-30 DIAGNOSIS — N3000 Acute cystitis without hematuria: Secondary | ICD-10-CM | POA: Diagnosis not present

## 2020-03-30 DIAGNOSIS — I10 Essential (primary) hypertension: Secondary | ICD-10-CM | POA: Diagnosis not present

## 2020-04-14 ENCOUNTER — Ambulatory Visit: Payer: PPO | Admitting: Psychology

## 2020-04-21 ENCOUNTER — Ambulatory Visit: Payer: PPO | Admitting: Psychology

## 2020-04-28 ENCOUNTER — Ambulatory Visit: Payer: PPO | Admitting: Psychology

## 2020-06-22 DIAGNOSIS — Z1211 Encounter for screening for malignant neoplasm of colon: Secondary | ICD-10-CM | POA: Diagnosis not present

## 2020-09-28 ENCOUNTER — Other Ambulatory Visit: Payer: Self-pay | Admitting: Family Medicine

## 2020-09-28 DIAGNOSIS — Z1231 Encounter for screening mammogram for malignant neoplasm of breast: Secondary | ICD-10-CM

## 2020-10-06 DIAGNOSIS — Z23 Encounter for immunization: Secondary | ICD-10-CM | POA: Diagnosis not present

## 2020-11-11 ENCOUNTER — Ambulatory Visit: Payer: PPO

## 2020-11-24 DIAGNOSIS — F330 Major depressive disorder, recurrent, mild: Secondary | ICD-10-CM | POA: Diagnosis not present

## 2020-12-16 ENCOUNTER — Other Ambulatory Visit: Payer: Self-pay

## 2020-12-16 ENCOUNTER — Ambulatory Visit
Admission: RE | Admit: 2020-12-16 | Discharge: 2020-12-16 | Disposition: A | Discharge status: Home / Self Care | Payer: PPO | Source: Ambulatory Visit | Attending: Family Medicine | Admitting: Family Medicine

## 2020-12-16 DIAGNOSIS — Z1231 Encounter for screening mammogram for malignant neoplasm of breast: Secondary | ICD-10-CM

## 2021-02-17 DIAGNOSIS — E049 Nontoxic goiter, unspecified: Secondary | ICD-10-CM | POA: Diagnosis not present

## 2021-02-17 DIAGNOSIS — D649 Anemia, unspecified: Secondary | ICD-10-CM | POA: Diagnosis not present

## 2021-02-17 DIAGNOSIS — R5383 Other fatigue: Secondary | ICD-10-CM | POA: Diagnosis not present

## 2021-04-08 DIAGNOSIS — E049 Nontoxic goiter, unspecified: Secondary | ICD-10-CM | POA: Diagnosis not present

## 2021-04-08 DIAGNOSIS — K219 Gastro-esophageal reflux disease without esophagitis: Secondary | ICD-10-CM | POA: Diagnosis not present

## 2021-04-08 DIAGNOSIS — Z131 Encounter for screening for diabetes mellitus: Secondary | ICD-10-CM | POA: Diagnosis not present

## 2021-04-08 DIAGNOSIS — Z79899 Other long term (current) drug therapy: Secondary | ICD-10-CM | POA: Diagnosis not present

## 2021-04-08 DIAGNOSIS — D649 Anemia, unspecified: Secondary | ICD-10-CM | POA: Diagnosis not present

## 2021-04-08 DIAGNOSIS — Z0000 Encounter for general adult medical examination without abnormal findings: Secondary | ICD-10-CM | POA: Diagnosis not present

## 2021-04-08 DIAGNOSIS — M8588 Other specified disorders of bone density and structure, other site: Secondary | ICD-10-CM | POA: Diagnosis not present

## 2021-04-08 DIAGNOSIS — F330 Major depressive disorder, recurrent, mild: Secondary | ICD-10-CM | POA: Diagnosis not present

## 2021-04-08 DIAGNOSIS — I10 Essential (primary) hypertension: Secondary | ICD-10-CM | POA: Diagnosis not present

## 2021-04-08 DIAGNOSIS — M8580 Other specified disorders of bone density and structure, unspecified site: Secondary | ICD-10-CM | POA: Diagnosis not present

## 2021-05-05 ENCOUNTER — Other Ambulatory Visit: Payer: Self-pay | Admitting: Surgery

## 2021-05-05 DIAGNOSIS — E041 Nontoxic single thyroid nodule: Secondary | ICD-10-CM

## 2021-05-24 ENCOUNTER — Other Ambulatory Visit: Payer: PPO

## 2021-05-24 ENCOUNTER — Ambulatory Visit
Admission: RE | Admit: 2021-05-24 | Discharge: 2021-05-24 | Disposition: A | Discharge status: Home / Self Care | Payer: PPO | Source: Ambulatory Visit | Attending: Surgery | Admitting: Surgery

## 2021-05-24 DIAGNOSIS — E041 Nontoxic single thyroid nodule: Secondary | ICD-10-CM

## 2021-05-24 DIAGNOSIS — E042 Nontoxic multinodular goiter: Secondary | ICD-10-CM | POA: Diagnosis not present

## 2021-05-31 DIAGNOSIS — K122 Cellulitis and abscess of mouth: Secondary | ICD-10-CM | POA: Diagnosis not present

## 2021-06-02 NOTE — Progress Notes (Signed)
USN shows slight change in the 1.5 cm nodule in the isthmus.  Biopsy is recommended. ? ?Shelby Woodward - please schedule for USN guided FNA biopsy at [facility].  Follow up with me in the office afterwards with TSH level. ? ?tmg ? ?Darnell Level, MD ?[facility] ?A [facility] practice ?Office: [phone] ?

## 2021-06-10 ENCOUNTER — Other Ambulatory Visit: Payer: Self-pay | Admitting: Surgery

## 2021-06-10 DIAGNOSIS — E041 Nontoxic single thyroid nodule: Secondary | ICD-10-CM

## 2021-06-11 DIAGNOSIS — E041 Nontoxic single thyroid nodule: Secondary | ICD-10-CM | POA: Diagnosis not present

## 2021-06-24 ENCOUNTER — Other Ambulatory Visit (HOSPITAL_COMMUNITY)
Admission: RE | Admit: 2021-06-24 | Discharge: 2021-06-24 | Disposition: A | Discharge status: Home / Self Care | Payer: PPO | Source: Ambulatory Visit | Attending: Interventional Radiology | Admitting: Interventional Radiology

## 2021-06-24 ENCOUNTER — Ambulatory Visit
Admission: RE | Admit: 2021-06-24 | Discharge: 2021-06-24 | Disposition: A | Discharge status: Home / Self Care | Payer: PPO | Source: Ambulatory Visit | Attending: Surgery | Admitting: Surgery

## 2021-06-24 DIAGNOSIS — E041 Nontoxic single thyroid nodule: Secondary | ICD-10-CM | POA: Insufficient documentation

## 2021-06-24 DIAGNOSIS — E042 Nontoxic multinodular goiter: Secondary | ICD-10-CM

## 2021-06-24 DIAGNOSIS — E0789 Other specified disorders of thyroid: Secondary | ICD-10-CM | POA: Diagnosis not present

## 2021-06-25 LAB — CYTOLOGY - NON PAP

## 2021-07-02 DIAGNOSIS — E041 Nontoxic single thyroid nodule: Secondary | ICD-10-CM | POA: Diagnosis not present

## 2021-07-09 NOTE — Progress Notes (Signed)
Atypia on FNA biopsy.  Await molecular genetic testing results [facility]).  tmg  Darnell Level, MD [facility] A [facility] practice Office: [phone]

## 2021-07-14 DIAGNOSIS — E042 Nontoxic multinodular goiter: Secondary | ICD-10-CM | POA: Diagnosis not present

## 2021-09-24 DIAGNOSIS — K219 Gastro-esophageal reflux disease without esophagitis: Secondary | ICD-10-CM | POA: Diagnosis not present

## 2021-09-24 DIAGNOSIS — Z1211 Encounter for screening for malignant neoplasm of colon: Secondary | ICD-10-CM | POA: Diagnosis not present

## 2021-09-24 DIAGNOSIS — B9681 Helicobacter pylori [H. pylori] as the cause of diseases classified elsewhere: Secondary | ICD-10-CM | POA: Diagnosis not present

## 2021-09-24 DIAGNOSIS — K5730 Diverticulosis of large intestine without perforation or abscess without bleeding: Secondary | ICD-10-CM | POA: Diagnosis not present

## 2021-09-24 DIAGNOSIS — K317 Polyp of stomach and duodenum: Secondary | ICD-10-CM | POA: Diagnosis not present

## 2021-09-24 DIAGNOSIS — K2930 Chronic superficial gastritis without bleeding: Secondary | ICD-10-CM | POA: Diagnosis not present

## 2021-09-24 DIAGNOSIS — K2950 Unspecified chronic gastritis without bleeding: Secondary | ICD-10-CM | POA: Diagnosis not present

## 2021-10-01 DIAGNOSIS — K2930 Chronic superficial gastritis without bleeding: Secondary | ICD-10-CM | POA: Diagnosis not present

## 2021-10-01 DIAGNOSIS — B9681 Helicobacter pylori [H. pylori] as the cause of diseases classified elsewhere: Secondary | ICD-10-CM | POA: Diagnosis not present

## 2021-10-01 DIAGNOSIS — K317 Polyp of stomach and duodenum: Secondary | ICD-10-CM | POA: Diagnosis not present

## 2021-10-14 DIAGNOSIS — R1031 Right lower quadrant pain: Secondary | ICD-10-CM | POA: Diagnosis not present

## 2021-10-14 DIAGNOSIS — F330 Major depressive disorder, recurrent, mild: Secondary | ICD-10-CM | POA: Diagnosis not present

## 2021-10-14 DIAGNOSIS — Z23 Encounter for immunization: Secondary | ICD-10-CM | POA: Diagnosis not present

## 2021-11-23 ENCOUNTER — Other Ambulatory Visit: Payer: Self-pay | Admitting: Family Medicine

## 2021-11-23 DIAGNOSIS — Z1231 Encounter for screening mammogram for malignant neoplasm of breast: Secondary | ICD-10-CM

## 2021-12-08 DIAGNOSIS — M6281 Muscle weakness (generalized): Secondary | ICD-10-CM | POA: Diagnosis not present

## 2021-12-08 DIAGNOSIS — M25651 Stiffness of right hip, not elsewhere classified: Secondary | ICD-10-CM | POA: Diagnosis not present

## 2021-12-08 DIAGNOSIS — M25451 Effusion, right hip: Secondary | ICD-10-CM | POA: Diagnosis not present

## 2021-12-08 DIAGNOSIS — M25551 Pain in right hip: Secondary | ICD-10-CM | POA: Diagnosis not present

## 2021-12-14 DIAGNOSIS — M25651 Stiffness of right hip, not elsewhere classified: Secondary | ICD-10-CM | POA: Diagnosis not present

## 2021-12-14 DIAGNOSIS — M25451 Effusion, right hip: Secondary | ICD-10-CM | POA: Diagnosis not present

## 2021-12-14 DIAGNOSIS — M25551 Pain in right hip: Secondary | ICD-10-CM | POA: Diagnosis not present

## 2021-12-14 DIAGNOSIS — M6281 Muscle weakness (generalized): Secondary | ICD-10-CM | POA: Diagnosis not present

## 2022-01-18 ENCOUNTER — Ambulatory Visit
Admission: RE | Admit: 2022-01-18 | Discharge: 2022-01-18 | Disposition: A | Discharge status: Home / Self Care | Payer: PPO | Source: Ambulatory Visit | Attending: Family Medicine | Admitting: Family Medicine

## 2022-01-18 DIAGNOSIS — Z1231 Encounter for screening mammogram for malignant neoplasm of breast: Secondary | ICD-10-CM

## 2022-02-17 DIAGNOSIS — A048 Other specified bacterial intestinal infections: Secondary | ICD-10-CM | POA: Diagnosis not present

## 2022-02-17 DIAGNOSIS — K219 Gastro-esophageal reflux disease without esophagitis: Secondary | ICD-10-CM | POA: Diagnosis not present

## 2022-02-21 DIAGNOSIS — U071 COVID-19: Secondary | ICD-10-CM | POA: Diagnosis not present

## 2022-02-21 DIAGNOSIS — R509 Fever, unspecified: Secondary | ICD-10-CM | POA: Diagnosis not present

## 2022-02-28 IMAGING — MG DIGITAL SCREENING BILAT W/ TOMO W/ CAD
8 series · 8 of 24 positions shown · non-contrast
Comparison: Previous exam(s).

CLINICAL DATA: Screening.

EXAM:
DIGITAL SCREENING BILATERAL MAMMOGRAM WITH TOMO AND CAD

[R MLO synth-2D]
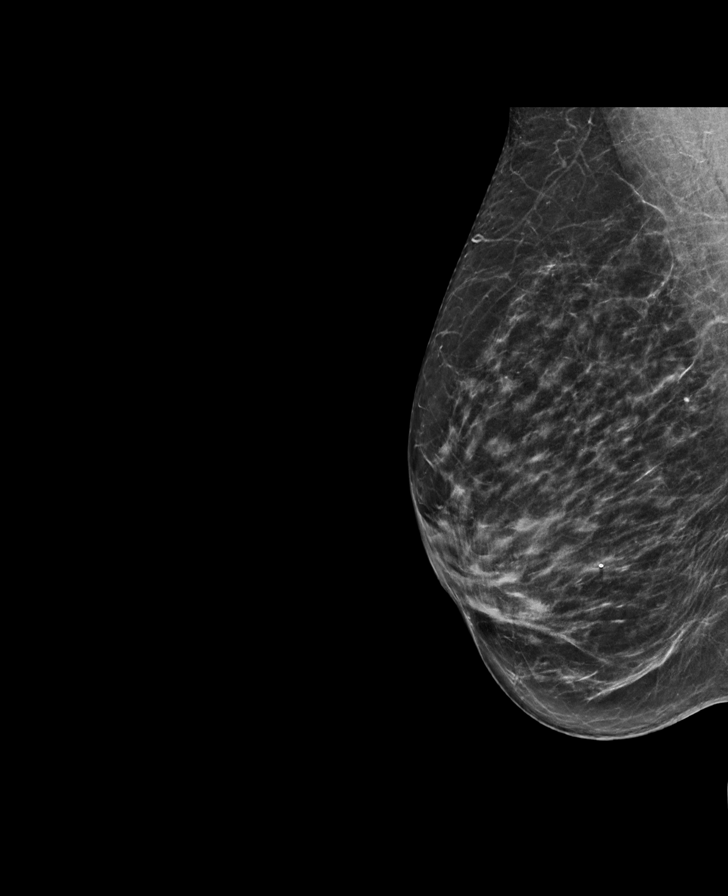

[R CC synth-2D]
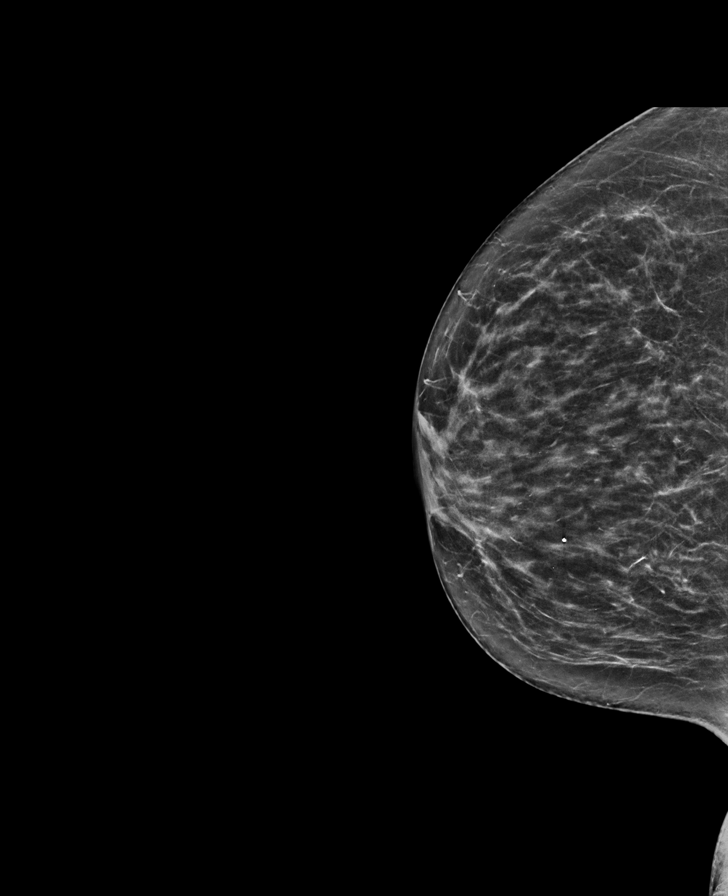

[L MLO synth-2D]
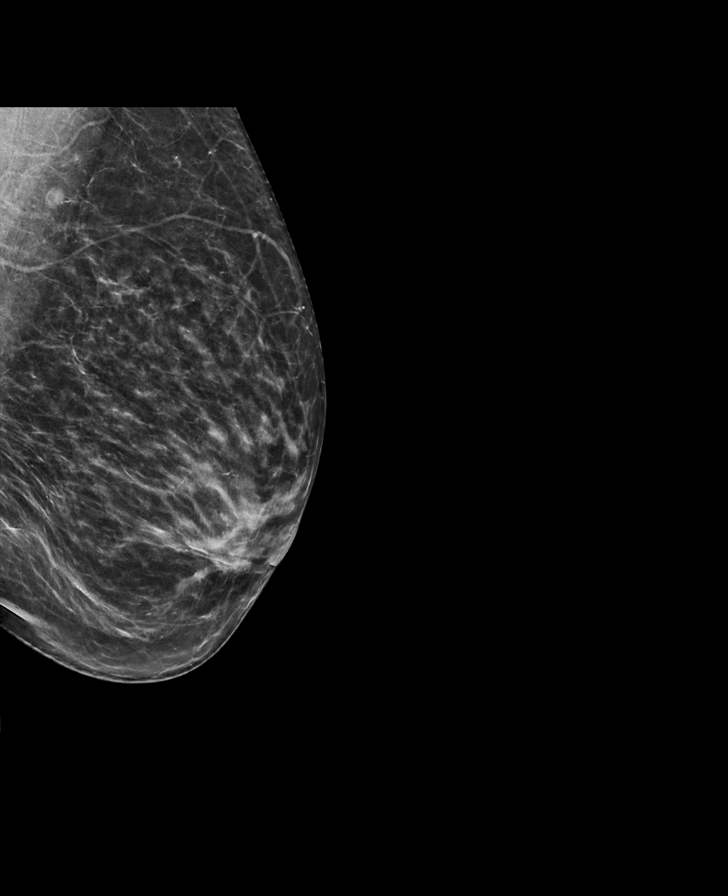

[L CC synth-2D]
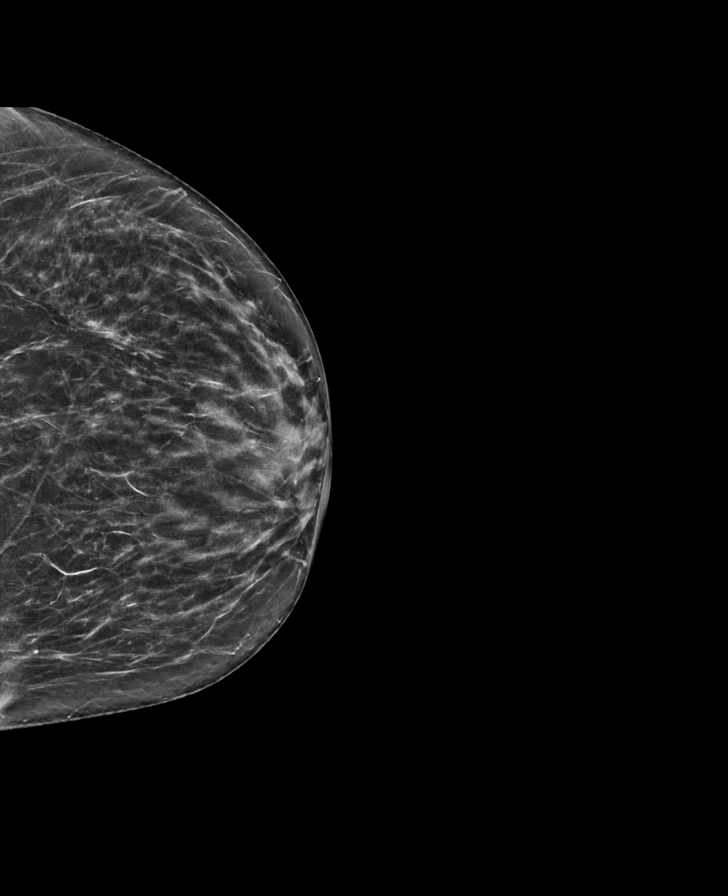

[L CC tomo · tomo slice 32/63.0]
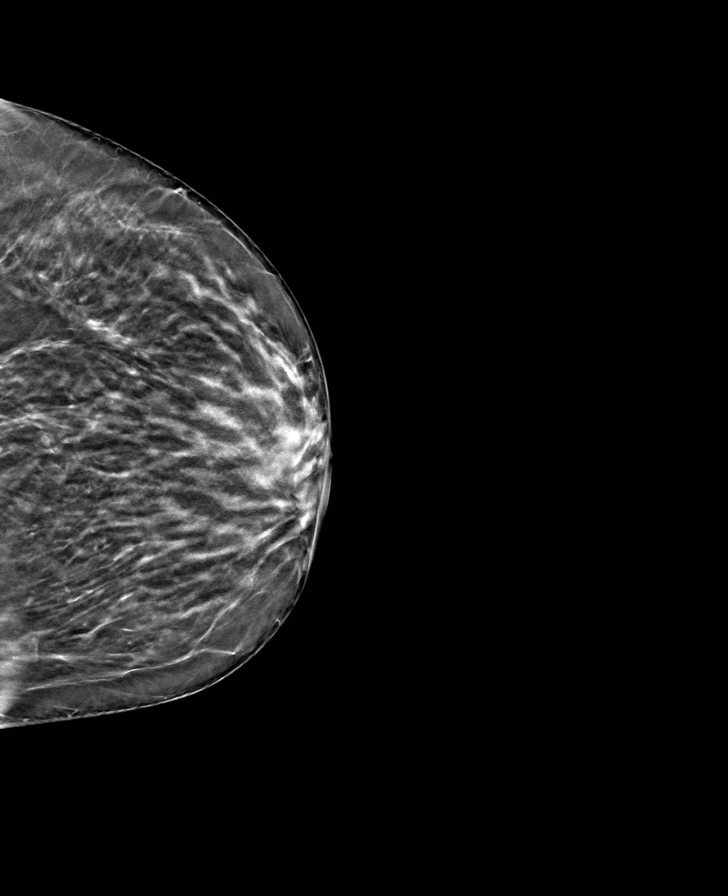

[R MLO tomo · tomo slice 34/67.0]
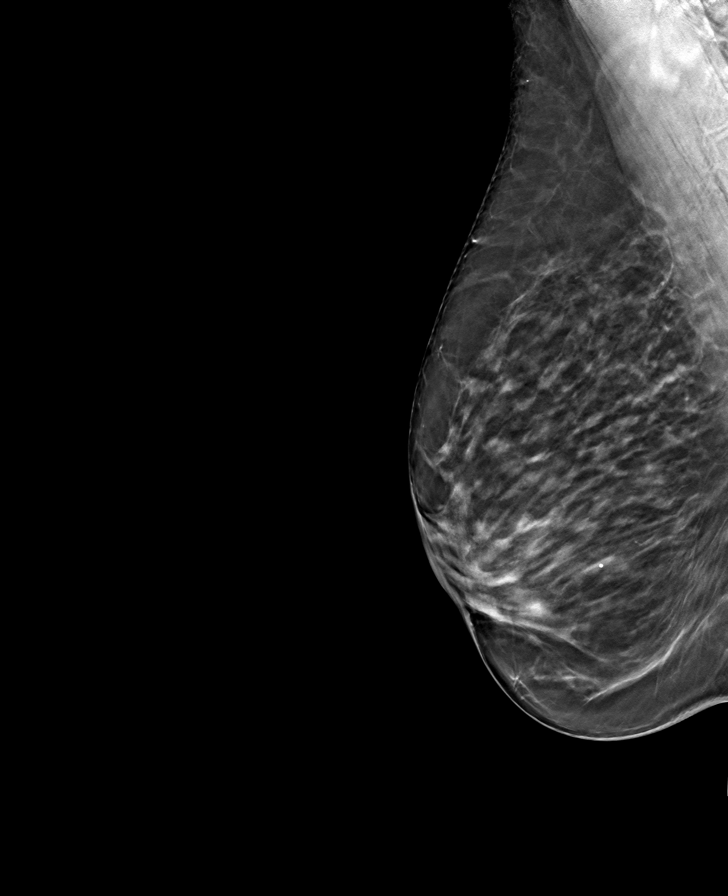

[L MLO tomo · tomo slice 35/68.0]
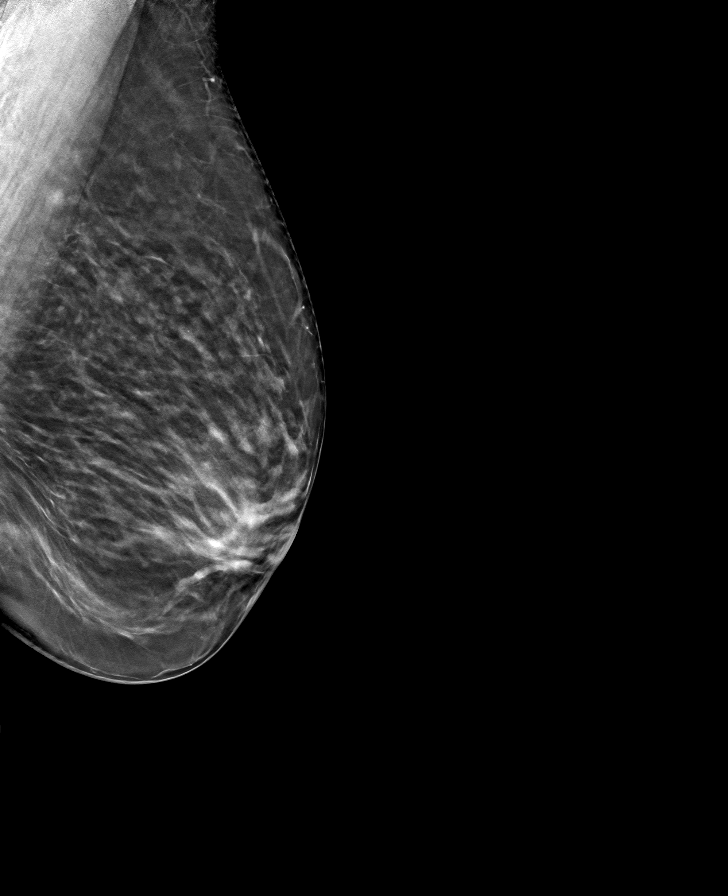

[R CC tomo · tomo slice 33/66.0]
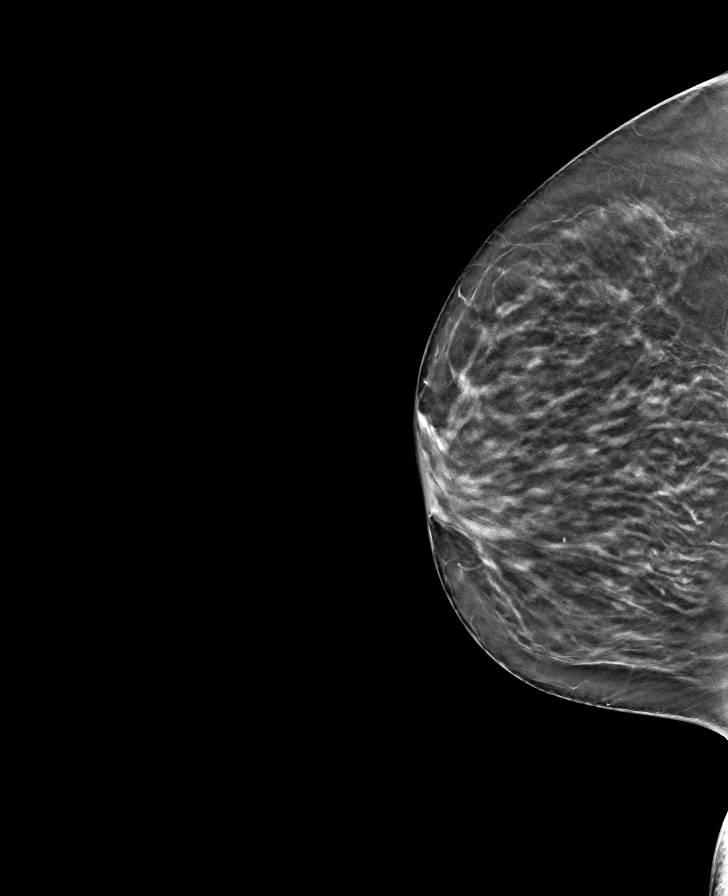

[8 of 24 positions shown; findings below may reference images not displayed]

ACR Breast Density Category b: There are scattered areas of
fibroglandular density.
FINDINGS: There are no findings suspicious for malignancy. Images were
processed with CAD.
IMPRESSION: No mammographic evidence of malignancy. A result letter of this
screening mammogram will be mailed directly to the patient.

RECOMMENDATION:
Screening mammogram in one year. (Code:[id number])

BI-RADS CATEGORY  1: Negative.

## 2022-03-07 DIAGNOSIS — A048 Other specified bacterial intestinal infections: Secondary | ICD-10-CM | POA: Diagnosis not present

## 2022-03-07 DIAGNOSIS — U071 COVID-19: Secondary | ICD-10-CM | POA: Diagnosis not present

## 2022-04-04 DIAGNOSIS — A048 Other specified bacterial intestinal infections: Secondary | ICD-10-CM | POA: Diagnosis not present

## 2022-04-04 DIAGNOSIS — K219 Gastro-esophageal reflux disease without esophagitis: Secondary | ICD-10-CM | POA: Diagnosis not present

## 2022-04-21 DIAGNOSIS — R7309 Other abnormal glucose: Secondary | ICD-10-CM | POA: Diagnosis not present

## 2022-04-21 DIAGNOSIS — U071 COVID-19: Secondary | ICD-10-CM | POA: Diagnosis not present

## 2022-05-09 DIAGNOSIS — D649 Anemia, unspecified: Secondary | ICD-10-CM | POA: Diagnosis not present

## 2022-05-09 DIAGNOSIS — Z79899 Other long term (current) drug therapy: Secondary | ICD-10-CM | POA: Diagnosis not present

## 2022-05-09 DIAGNOSIS — I10 Essential (primary) hypertension: Secondary | ICD-10-CM | POA: Diagnosis not present

## 2022-05-09 DIAGNOSIS — Z0000 Encounter for general adult medical examination without abnormal findings: Secondary | ICD-10-CM | POA: Diagnosis not present

## 2022-05-09 DIAGNOSIS — F330 Major depressive disorder, recurrent, mild: Secondary | ICD-10-CM | POA: Diagnosis not present

## 2022-05-09 DIAGNOSIS — E049 Nontoxic goiter, unspecified: Secondary | ICD-10-CM | POA: Diagnosis not present

## 2022-05-09 DIAGNOSIS — E7800 Pure hypercholesterolemia, unspecified: Secondary | ICD-10-CM | POA: Diagnosis not present

## 2022-05-09 DIAGNOSIS — M8588 Other specified disorders of bone density and structure, other site: Secondary | ICD-10-CM | POA: Diagnosis not present

## 2022-05-09 DIAGNOSIS — R5383 Other fatigue: Secondary | ICD-10-CM | POA: Diagnosis not present

## 2022-05-11 DIAGNOSIS — M25511 Pain in right shoulder: Secondary | ICD-10-CM | POA: Diagnosis not present

## 2022-05-11 DIAGNOSIS — M79674 Pain in right toe(s): Secondary | ICD-10-CM | POA: Diagnosis not present

## 2022-05-11 DIAGNOSIS — M7502 Adhesive capsulitis of left shoulder: Secondary | ICD-10-CM | POA: Diagnosis not present

## 2022-05-30 DIAGNOSIS — M778 Other enthesopathies, not elsewhere classified: Secondary | ICD-10-CM | POA: Diagnosis not present

## 2022-07-07 ENCOUNTER — Other Ambulatory Visit: Payer: Self-pay | Admitting: Surgery

## 2022-07-07 DIAGNOSIS — E042 Nontoxic multinodular goiter: Secondary | ICD-10-CM

## 2022-07-08 DIAGNOSIS — R140 Abdominal distension (gaseous): Secondary | ICD-10-CM | POA: Diagnosis not present

## 2022-07-08 DIAGNOSIS — K219 Gastro-esophageal reflux disease without esophagitis: Secondary | ICD-10-CM | POA: Diagnosis not present

## 2022-07-08 DIAGNOSIS — A048 Other specified bacterial intestinal infections: Secondary | ICD-10-CM | POA: Diagnosis not present

## 2022-07-12 ENCOUNTER — Ambulatory Visit
Admission: RE | Admit: 2022-07-12 | Discharge: 2022-07-12 | Disposition: A | Discharge status: Home / Self Care | Payer: PPO | Source: Ambulatory Visit | Attending: Surgery | Admitting: Surgery

## 2022-07-12 DIAGNOSIS — E042 Nontoxic multinodular goiter: Secondary | ICD-10-CM | POA: Diagnosis not present

## 2022-07-19 DIAGNOSIS — M7581 Other shoulder lesions, right shoulder: Secondary | ICD-10-CM | POA: Diagnosis not present

## 2022-07-19 DIAGNOSIS — F330 Major depressive disorder, recurrent, mild: Secondary | ICD-10-CM | POA: Diagnosis not present

## 2022-07-19 DIAGNOSIS — M779 Enthesopathy, unspecified: Secondary | ICD-10-CM | POA: Diagnosis not present

## 2022-07-26 DIAGNOSIS — A048 Other specified bacterial intestinal infections: Secondary | ICD-10-CM | POA: Diagnosis not present

## 2022-07-27 DIAGNOSIS — M25511 Pain in right shoulder: Secondary | ICD-10-CM | POA: Diagnosis not present

## 2022-07-27 DIAGNOSIS — M25611 Stiffness of right shoulder, not elsewhere classified: Secondary | ICD-10-CM | POA: Diagnosis not present

## 2022-07-27 DIAGNOSIS — M25571 Pain in right ankle and joints of right foot: Secondary | ICD-10-CM | POA: Diagnosis not present

## 2022-07-27 DIAGNOSIS — M6281 Muscle weakness (generalized): Secondary | ICD-10-CM | POA: Diagnosis not present

## 2022-08-08 DIAGNOSIS — M25611 Stiffness of right shoulder, not elsewhere classified: Secondary | ICD-10-CM | POA: Diagnosis not present

## 2022-08-08 DIAGNOSIS — M25511 Pain in right shoulder: Secondary | ICD-10-CM | POA: Diagnosis not present

## 2022-08-08 DIAGNOSIS — M25571 Pain in right ankle and joints of right foot: Secondary | ICD-10-CM | POA: Diagnosis not present

## 2022-08-08 DIAGNOSIS — M6281 Muscle weakness (generalized): Secondary | ICD-10-CM | POA: Diagnosis not present

## 2022-08-15 DIAGNOSIS — M25571 Pain in right ankle and joints of right foot: Secondary | ICD-10-CM | POA: Diagnosis not present

## 2022-08-15 DIAGNOSIS — M25611 Stiffness of right shoulder, not elsewhere classified: Secondary | ICD-10-CM | POA: Diagnosis not present

## 2022-08-15 DIAGNOSIS — M6281 Muscle weakness (generalized): Secondary | ICD-10-CM | POA: Diagnosis not present

## 2022-08-15 DIAGNOSIS — M25511 Pain in right shoulder: Secondary | ICD-10-CM | POA: Diagnosis not present

## 2022-08-24 DIAGNOSIS — E042 Nontoxic multinodular goiter: Secondary | ICD-10-CM | POA: Diagnosis not present

## 2022-08-26 DIAGNOSIS — M25611 Stiffness of right shoulder, not elsewhere classified: Secondary | ICD-10-CM | POA: Diagnosis not present

## 2022-08-26 DIAGNOSIS — M25571 Pain in right ankle and joints of right foot: Secondary | ICD-10-CM | POA: Diagnosis not present

## 2022-08-26 DIAGNOSIS — M25511 Pain in right shoulder: Secondary | ICD-10-CM | POA: Diagnosis not present

## 2022-08-26 DIAGNOSIS — M6281 Muscle weakness (generalized): Secondary | ICD-10-CM | POA: Diagnosis not present

## 2022-09-02 DIAGNOSIS — M6281 Muscle weakness (generalized): Secondary | ICD-10-CM | POA: Diagnosis not present

## 2022-09-02 DIAGNOSIS — M25571 Pain in right ankle and joints of right foot: Secondary | ICD-10-CM | POA: Diagnosis not present

## 2022-09-02 DIAGNOSIS — M25511 Pain in right shoulder: Secondary | ICD-10-CM | POA: Diagnosis not present

## 2022-09-02 DIAGNOSIS — M25611 Stiffness of right shoulder, not elsewhere classified: Secondary | ICD-10-CM | POA: Diagnosis not present

## 2022-09-07 DIAGNOSIS — M6281 Muscle weakness (generalized): Secondary | ICD-10-CM | POA: Diagnosis not present

## 2022-09-07 DIAGNOSIS — M25571 Pain in right ankle and joints of right foot: Secondary | ICD-10-CM | POA: Diagnosis not present

## 2022-09-07 DIAGNOSIS — M25511 Pain in right shoulder: Secondary | ICD-10-CM | POA: Diagnosis not present

## 2022-09-07 DIAGNOSIS — M25611 Stiffness of right shoulder, not elsewhere classified: Secondary | ICD-10-CM | POA: Diagnosis not present

## 2022-09-21 DIAGNOSIS — M25571 Pain in right ankle and joints of right foot: Secondary | ICD-10-CM | POA: Diagnosis not present

## 2022-09-21 DIAGNOSIS — M25511 Pain in right shoulder: Secondary | ICD-10-CM | POA: Diagnosis not present

## 2022-09-21 DIAGNOSIS — M6281 Muscle weakness (generalized): Secondary | ICD-10-CM | POA: Diagnosis not present

## 2022-09-21 DIAGNOSIS — M25611 Stiffness of right shoulder, not elsewhere classified: Secondary | ICD-10-CM | POA: Diagnosis not present

## 2022-10-19 DIAGNOSIS — F331 Major depressive disorder, recurrent, moderate: Secondary | ICD-10-CM | POA: Diagnosis not present

## 2022-10-19 DIAGNOSIS — Z23 Encounter for immunization: Secondary | ICD-10-CM | POA: Diagnosis not present

## 2022-10-19 DIAGNOSIS — N959 Unspecified menopausal and perimenopausal disorder: Secondary | ICD-10-CM | POA: Diagnosis not present

## 2022-10-19 DIAGNOSIS — E7800 Pure hypercholesterolemia, unspecified: Secondary | ICD-10-CM | POA: Diagnosis not present

## 2022-11-09 DIAGNOSIS — K219 Gastro-esophageal reflux disease without esophagitis: Secondary | ICD-10-CM | POA: Diagnosis not present

## 2022-11-09 DIAGNOSIS — A048 Other specified bacterial intestinal infections: Secondary | ICD-10-CM | POA: Diagnosis not present

## 2022-11-21 DIAGNOSIS — R000 Tachycardia, unspecified: Secondary | ICD-10-CM | POA: Diagnosis not present

## 2022-11-21 DIAGNOSIS — I10 Essential (primary) hypertension: Secondary | ICD-10-CM | POA: Diagnosis not present

## 2022-11-23 DIAGNOSIS — A048 Other specified bacterial intestinal infections: Secondary | ICD-10-CM | POA: Diagnosis not present

## 2022-11-30 DIAGNOSIS — M25511 Pain in right shoulder: Secondary | ICD-10-CM | POA: Diagnosis not present

## 2022-12-19 ENCOUNTER — Other Ambulatory Visit: Payer: Self-pay | Admitting: Family Medicine

## 2022-12-19 DIAGNOSIS — Z1231 Encounter for screening mammogram for malignant neoplasm of breast: Secondary | ICD-10-CM

## 2023-01-31 ENCOUNTER — Ambulatory Visit
Admission: RE | Admit: 2023-01-31 | Discharge: 2023-01-31 | Disposition: A | Discharge status: Home / Self Care | Payer: PPO | Source: Ambulatory Visit | Attending: Family Medicine | Admitting: Family Medicine

## 2023-01-31 DIAGNOSIS — Z1231 Encounter for screening mammogram for malignant neoplasm of breast: Secondary | ICD-10-CM

## 2023-04-08 ENCOUNTER — Emergency Department (HOSPITAL_COMMUNITY)
Admission: EM | Admit: 2023-04-08 | Discharge: 2023-04-08 | Disposition: A | Discharge status: Home / Self Care | Attending: Emergency Medicine | Admitting: Emergency Medicine

## 2023-04-08 ENCOUNTER — Other Ambulatory Visit: Payer: Self-pay

## 2023-04-08 DIAGNOSIS — I10 Essential (primary) hypertension: Secondary | ICD-10-CM | POA: Insufficient documentation

## 2023-04-08 LAB — BASIC METABOLIC PANEL
Anion gap: 9 (ref 5–15)
BUN: 15 mg/dL (ref 8–23)
CO2: 25 mmol/L (ref 22–32)
Calcium: 9.9 mg/dL (ref 8.9–10.3)
Chloride: 104 mmol/L (ref 98–111)
Creatinine, Ser: 0.51 mg/dL (ref 0.44–1.00)
GFR, Estimated: >60 mL/min (ref >60)
Glucose, Bld: 106 mg/dL — ABNORMAL HIGH (ref 70–99)
Potassium: 3.7 mmol/L (ref 3.5–5.1)
Sodium: 138 mmol/L (ref 135–145)

## 2023-04-08 LAB — CBC WITH DIFFERENTIAL/PLATELET
Abs Immature Granulocytes: 0.02 10*3/uL (ref 0.00–0.07)
Basophils Absolute: 0.1 10*3/uL (ref 0.0–0.1)
Basophils Relative: 1 %
Eosinophils Absolute: 0.1 10*3/uL (ref 0.0–0.5)
Eosinophils Relative: 1 %
HCT: 38.6 % (ref 36.0–46.0)
Hemoglobin: 11.7 g/dL — ABNORMAL LOW (ref 12.0–15.0)
Immature Granulocytes: 0 %
Lymphocytes Relative: 19 %
Lymphs Abs: 1.5 10*3/uL (ref 0.7–4.0)
MCH: 25.9 pg — ABNORMAL LOW (ref 26.0–34.0)
MCHC: 30.3 g/dL (ref 30.0–36.0)
MCV: 85.4 fL (ref 80.0–100.0)
Monocytes Absolute: 0.5 10*3/uL (ref 0.1–1.0)
Monocytes Relative: 6 %
Neutro Abs: 5.6 10*3/uL (ref 1.7–7.7)
Neutrophils Relative %: 73 %
Platelets: 344 10*3/uL (ref 150–400)
RBC: 4.52 MIL/uL (ref 3.87–5.11)
RDW: 16.3 % — ABNORMAL HIGH (ref 11.5–15.5)
WBC: 7.6 10*3/uL (ref 4.0–10.5)
nRBC: 0 % (ref 0.0–0.2)

## 2023-04-08 LAB — MAGNESIUM: Magnesium: 2.1 mg/dL (ref 1.7–2.4)

## 2023-04-08 LAB — TSH: TSH: 1.191 u[IU]/mL (ref 0.350–4.500)

## 2023-04-08 MED ORDER — AMLODIPINE BESYLATE 5 MG PO TABS: 5.0000 mg | ORAL_TABLET | Freq: Every day | ORAL | 1 refills | Status: DC

## 2023-04-08 NOTE — ED Provider Notes (Signed)
 [place] EMERGENCY DEPARTMENT AT [facility] Provider Note   CSN: [id number] Arrival date & time: 04/08/23  0801     History  Chief Complaint  Patient presents with   Hypertension    Shelby Woodward is a 68 y.o. female.  68 year old female with prior medical history as detailed below presents for evaluation.  Patient reports longstanding history of hypertension.  She reports compliance with previously prescribed antihypertensives.  Her last dose of her blood pressure medicine was this morning.  She reports that she has noted a gradual increase in her blood pressure over the last week or so.  She reports some increased stress and anxiety at home.  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  This is very stressful.  She also reports some intermittent palpitations.  She attempted to contact her PCP regarding the symptoms and was told that she would need to be evaluated in approximately 2 weeks (next available appointment).  Patient reports that she currently only takes Hyzaar.  She has been on this medication at this dose for at least a year.  The history is provided by the patient and medical records.       Home Medications Prior to Admission medications   Medication Sig Start Date End Date Taking? Authorizing Provider  amLODipine (NORVASC) 5 MG tablet Take 5 mg by mouth daily.    [provider]  esomeprazole (NEXIUM) 40 MG capsule Take 40 mg by mouth daily before breakfast.    [provider]  losartan-hydrochlorothiazide (HYZAAR) 100-25 MG per tablet Take 1 tablet by mouth daily.    [provider]  simvastatin (ZOCOR) 20 MG tablet Take 20 mg by mouth every evening.    [provider]      Allergies    Penicillins    Review of Systems   Review of Systems  All other systems reviewed and are negative.   Physical Exam Updated Vital Signs BP (!) 185/101 (BP Location: Right Arm)   Pulse 69   Temp 98.8 F (37.1 C) (Oral)   Resp 18    Ht 5\' 1"  (1.549 m)   Wt 65.6 kg   SpO2 100%   BMI 27.33 kg/m  Physical Exam Vitals and nursing note reviewed.  Constitutional:      General: She is not in acute distress.    Appearance: Normal appearance. She is well-developed.  HENT:     Head: Normocephalic and atraumatic.  Eyes:     Conjunctiva/sclera: Conjunctivae normal.     Pupils: Pupils are equal, round, and reactive to light.  Cardiovascular:     Rate and Rhythm: Normal rate and regular rhythm.     Heart sounds: Normal heart sounds.  Pulmonary:     Effort: Pulmonary effort is normal. No respiratory distress.     Breath sounds: Normal breath sounds.  Abdominal:     General: There is no distension.     Palpations: Abdomen is soft.     Tenderness: There is no abdominal tenderness.  Musculoskeletal:        General: No deformity. Normal range of motion.     Cervical back: Normal range of motion and neck supple.  Skin:    General: Skin is warm and dry.  Neurological:     General: No focal deficit present.     Mental Status: She is alert and oriented to person, place, and time.     ED Results / Procedures / Treatments   Labs (all labs  ordered are listed, but only abnormal results are displayed) Labs Reviewed  CBC WITH DIFFERENTIAL/PLATELET  BASIC METABOLIC PANEL  MAGNESIUM  TSH    EKG EKG Interpretation Date/Time:  Saturday April 08 2023 08:13:04 EST Ventricular Rate:  64 PR Interval:  173 QRS Duration:  93 QT Interval:  398 QTC Calculation: 411 R Axis:   -9  Text Interpretation: Sinus rhythm Confirmed by Kristine Royal [phone]) on 04/08/2023 8:42:35 AM  Radiology No results found.  Procedures Procedures    Medications Ordered in ED Medications - No data to display  ED Course/ Medical Decision Making/ A&P                                 Medical Decision Making Amount and/or Complexity of Data Reviewed Labs: ordered.  Risk Prescription drug management.    Medical Screen Complete  This  patient presented to the ED with complaint of elevated blood pressure..  This complaint involves an extensive number of treatment options. The initial differential diagnosis includes, but is not limited to, endorgan damage related to elevated blood pressure, Metabolic abnormality, etc.  This presentation is: Chronic, Self-Limited, Previously Undiagnosed, Uncertain Prognosis, and Complicated  Patient with longstanding history of hypertension.  She reports recent poorly controlled blood pressures with elevations up into the 170s 180s at home.  She also reports some intermittent palpitations.  She is visibly anxious on initial evaluation.  Blood pressure is noted to be into the 180s systolic on arrival.  It is improved by discharge to 150 systolic.  No intervention or medication was given in the ED for her blood pressure.  Patient without evidence of endorgan damage.  Patient does have established PCP.  Patient is currently compliant with Hyzaar as previously prescribed.  She is advised that restarting a small dose of Norvasc, pending her PCPs approval, would be appropriate.  She does have a follow-up appointment already scheduled for her PCP in approximately 2 weeks.  She is advised to call her PCP on Monday.  Importance of close follow-up stressed.  Strict return precautions given understood.  Co morbidities that complicated the patient's evaluation  See HPI   Additional history obtained: External records from outside sources obtained and reviewed including prior ED visits and prior Inpatient records.    Lab Tests:  I ordered and personally interpreted labs.  The pertinent results include: CBC, BMP, TSH, magnesium   Cardiac Monitoring:  The patient was maintained on a cardiac monitor.  I personally viewed and interpreted the cardiac monitor which showed an underlying rhythm of: NSR   Problem List / ED Course:  Hypertension   Reevaluation:  After the interventions noted above, I  reevaluated the patient and found that they have: improved   Disposition:  After consideration of the diagnostic results and the patients response to treatment, I feel that the patent would benefit from close outpatient follow-up.          Final Clinical Impression(s) / ED Diagnoses Final diagnoses:  Hypertension, unspecified type    Rx / DC Orders ED Discharge Orders          Ordered    amLODipine (NORVASC) 5 MG tablet  Daily        04/08/23 1057              Wynetta Fines, MD 04/08/23 1112

## 2023-04-08 NOTE — ED Triage Notes (Addendum)
 Pt bib EMS. HTN for the last few days. Primary can't see her until a week or more out. Pt sole caregiver of her husband who had a stroke. 202/120 BP 206/104 BP 2nd for EMS. Pt says she feels heart palpitations. Family Hx of strokes. Took BP meds this morning

## 2023-04-08 NOTE — Discharge Instructions (Addendum)
 Return for any problem.   As discussed, start taking Norvasc (5 mg) daily in addition to your already prescribed Hyzaar.  Keep track of your blood pressures.  Follow-up closely with your regular care provider on Monday morning.

## 2023-04-19 ENCOUNTER — Other Ambulatory Visit (HOSPITAL_COMMUNITY): Payer: Self-pay | Admitting: Family Medicine

## 2023-04-19 DIAGNOSIS — M2550 Pain in unspecified joint: Secondary | ICD-10-CM | POA: Diagnosis not present

## 2023-04-19 DIAGNOSIS — R5383 Other fatigue: Secondary | ICD-10-CM | POA: Diagnosis not present

## 2023-04-19 DIAGNOSIS — E785 Hyperlipidemia, unspecified: Secondary | ICD-10-CM | POA: Diagnosis not present

## 2023-04-19 DIAGNOSIS — Z8639 Personal history of other endocrine, nutritional and metabolic disease: Secondary | ICD-10-CM | POA: Diagnosis not present

## 2023-04-19 DIAGNOSIS — I10 Essential (primary) hypertension: Secondary | ICD-10-CM | POA: Diagnosis not present

## 2023-05-01 ENCOUNTER — Ambulatory Visit (HOSPITAL_COMMUNITY)
Admission: RE | Admit: 2023-05-01 | Discharge: 2023-05-01 | Disposition: A | Discharge status: Home / Self Care | Payer: Self-pay | Source: Ambulatory Visit | Attending: Family Medicine | Admitting: Family Medicine

## 2023-05-01 DIAGNOSIS — E785 Hyperlipidemia, unspecified: Secondary | ICD-10-CM | POA: Insufficient documentation

## 2023-05-10 DIAGNOSIS — I10 Essential (primary) hypertension: Secondary | ICD-10-CM | POA: Diagnosis not present

## 2023-05-10 DIAGNOSIS — M25519 Pain in unspecified shoulder: Secondary | ICD-10-CM | POA: Diagnosis not present

## 2023-05-16 DIAGNOSIS — L239 Allergic contact dermatitis, unspecified cause: Secondary | ICD-10-CM | POA: Diagnosis not present

## 2023-05-16 DIAGNOSIS — R21 Rash and other nonspecific skin eruption: Secondary | ICD-10-CM | POA: Diagnosis not present

## 2023-05-22 DIAGNOSIS — I10 Essential (primary) hypertension: Secondary | ICD-10-CM | POA: Diagnosis not present

## 2023-05-22 DIAGNOSIS — R21 Rash and other nonspecific skin eruption: Secondary | ICD-10-CM | POA: Diagnosis not present

## 2023-07-26 ENCOUNTER — Other Ambulatory Visit: Payer: Self-pay | Admitting: Family Medicine

## 2023-07-26 DIAGNOSIS — Z1231 Encounter for screening mammogram for malignant neoplasm of breast: Secondary | ICD-10-CM

## 2023-08-07 DIAGNOSIS — I10 Essential (primary) hypertension: Secondary | ICD-10-CM | POA: Diagnosis not present

## 2023-08-10 DIAGNOSIS — Z0000 Encounter for general adult medical examination without abnormal findings: Secondary | ICD-10-CM | POA: Diagnosis not present

## 2023-08-10 DIAGNOSIS — E7800 Pure hypercholesterolemia, unspecified: Secondary | ICD-10-CM | POA: Diagnosis not present

## 2023-08-10 DIAGNOSIS — Z8639 Personal history of other endocrine, nutritional and metabolic disease: Secondary | ICD-10-CM | POA: Diagnosis not present

## 2023-08-10 DIAGNOSIS — I2510 Atherosclerotic heart disease of native coronary artery without angina pectoris: Secondary | ICD-10-CM | POA: Diagnosis not present

## 2023-08-10 DIAGNOSIS — D649 Anemia, unspecified: Secondary | ICD-10-CM | POA: Diagnosis not present

## 2023-08-10 DIAGNOSIS — Z79899 Other long term (current) drug therapy: Secondary | ICD-10-CM | POA: Diagnosis not present

## 2023-08-10 DIAGNOSIS — E049 Nontoxic goiter, unspecified: Secondary | ICD-10-CM | POA: Diagnosis not present

## 2023-08-10 DIAGNOSIS — F331 Major depressive disorder, recurrent, moderate: Secondary | ICD-10-CM | POA: Diagnosis not present

## 2023-08-16 ENCOUNTER — Institutional Professional Consult (permissible substitution) (HOSPITAL_BASED_OUTPATIENT_CLINIC_OR_DEPARTMENT_OTHER): Admitting: Nurse Practitioner

## 2023-08-29 DIAGNOSIS — D649 Anemia, unspecified: Secondary | ICD-10-CM | POA: Diagnosis not present

## 2023-08-31 DIAGNOSIS — E785 Hyperlipidemia, unspecified: Secondary | ICD-10-CM | POA: Diagnosis not present

## 2023-08-31 DIAGNOSIS — I10 Essential (primary) hypertension: Secondary | ICD-10-CM | POA: Diagnosis not present

## 2023-08-31 DIAGNOSIS — F330 Major depressive disorder, recurrent, mild: Secondary | ICD-10-CM | POA: Diagnosis not present

## 2023-08-31 DIAGNOSIS — F331 Major depressive disorder, recurrent, moderate: Secondary | ICD-10-CM | POA: Diagnosis not present

## 2023-09-05 DIAGNOSIS — I10 Essential (primary) hypertension: Secondary | ICD-10-CM | POA: Diagnosis not present

## 2023-09-12 DIAGNOSIS — M25512 Pain in left shoulder: Secondary | ICD-10-CM | POA: Diagnosis not present

## 2023-09-12 DIAGNOSIS — M25511 Pain in right shoulder: Secondary | ICD-10-CM | POA: Diagnosis not present

## 2023-09-14 DIAGNOSIS — D509 Iron deficiency anemia, unspecified: Secondary | ICD-10-CM | POA: Diagnosis not present

## 2023-09-19 NOTE — Progress Notes (Unsigned)
 Cardiology Office Note:  .   Date:  09/20/2023 ID:  Aleiah, Mohammed 1955/02/09, MRN [id number] PCP: Verena Mems, MD [facility] Providers Cardiologist:  None   Patient Profile: .      PMH Hypertension Hyperlipidemia Aortic atherosclerosis Coronary artery calcification CT Calcium  score 513 (93rd percentile) LM 0, LAD 161, RCA 348, LCx 3.97 Anemia       History of Present Illness: .    Discussed the use of AI scribe software for clinical note transcription with the patient, who gave verbal consent to proceed.  History of Present Illness JISSELL TRAFTON is a very pleasant 68 year old female who is here for evaluation as a new patient due to elevated coronary artery calcium  score. Calcium  score is 513, with calcification in three of the four major heart arteries, predominantly in the right coronary artery. She is concerned about her cardiovascular risk and the safety of exercising with this profile. She admits to significant life stress caring for her husband who has had 2 strokes and has Parkinson's.  She has help a few hours a day but generally feels overwhelmed.  They used to exercise together on a regular basis but she reports she does not have time to exercise currently.  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  She often wakes up with throbbing in her head.  She monitors BP at home, but not particularly at times when she has headache. Pain is generally relieved by aspirin.  She is active with caring for her husband. She denies chest pain, shortness of breath, palpitations, orthopnea, PND, edema, presyncope, syncope. Current medications include amlodipine  and losartan hydrochlorothiazide for hypertension, and simvastatin for high cholesterol. She adheres to a healthy diet, avoiding fried foods, processed food, and limiting red meat. Family history is significant for cardiovascular disease. Her mother had three heart attacks  and a heart transplant, and her father had a cerebral hemorrhage. She has three sisters, one with severe diabetes and another with high blood pressure. She generally feels exhausted but would like to start exercising.    Family history: Her family history includes Heart disease in her mother; Hypertension in her sister; Stroke in her father.  Mother had MI x 3, received heart transplant in [place], died a few months ago Father had cerebral hemorrhage 3 sisters - one with bad diabetes, one has HTN,   Discussed the use of AI scribe software for clinical note transcription with the patient, who gave verbal consent to proceed.  ASCVD Risk Score: ASCVD (Atherosclerotic Cardiovascular Disease) Risk Algorithm including Known ASCVD from AHA/ACC from [email or web address]  on 09/19/2023 ** All calculations should be rechecked by clinician prior to use **  RESULT SUMMARY: 10.0 % Risk of cardiovascular event (coronary or stroke death or non-fatal MI or stroke) in next 10 years.  Moderate- to high-intensity statin recommended because 10-year risk >7.5%    ACC/AHA recommends initiating statins at this risk level, but the USPSTF recommends a trial of lifestyle intervention first - use clinician judgment and shared decision making in deciding whether or not to start a statin  To view statin dosages by intensity, see Evidence section.  INPUTS: History of ASCVD --> 0 = No LDL Cholesterol >=190mg /dL (5.07 mmol/L) --> 0 = No Age --> 68 years Diabetes --> 0 = No Sex --> 0 = Female Total Cholesterol --> 206 mg/dL HDL Cholesterol --> 58 mg/dL Systolic Blood Pressure --> 114 mm Hg Treatment for Hypertension -->  1 = Yes Smoker --> 0 = No Race --> 2 = African American  Diet: Mostly fish, [place], chicken Rare red meat Avocado oil No fried food Likes vegetables   Activity: Cares for her husband who has had 2 strokes and has Parkinson's  Previously worked out regularly with her husband, unable to exercise  currently  No results found for: LIPOA    ROS: See HPI      Studies Reviewed: .         CT Calcium  Score 05/01/23 Non-cardiac: No significant non cardiac findings on limited lung and soft tissue windows. See separate report from [facility].   Ascending Aorta: Normal diameter 3.3 cm   Pericardium: Normal   Coronary arteries: 3 vessel coronary calcium  noted   LM 0   LAD 161   RCA 348   LCX 3.97   Total 513   IMPRESSION: Coronary calcium  score of 513. This was 43 rd percentile for age and sex matched control.   Risk Assessment/Calculations:             Physical Exam:   VS: BP 138/64 (BP Location: Left Arm, Patient Position: Sitting, Cuff Size: Normal)   Pulse 86   Ht 4' 11.5 (1.511 m)   Wt 138 lb 9.6 oz (62.9 kg)   SpO2 99%   BMI 27.53 kg/m   Wt Readings from Last 3 Encounters:  09/20/23 138 lb 9.6 oz (62.9 kg)  04/08/23 144 lb 10 oz (65.6 kg)  11/12/12 144 lb 9.6 oz (65.6 kg)     GEN: Well nourished, well developed in no acute distress NECK: No JVD; No carotid bruits CARDIAC: RRR, no murmurs, rubs, gallops RESPIRATORY:  Clear to auscultation without rales, wheezing or rhonchi  ABDOMEN: Soft, non-tender, non-distended EXTREMITIES:  No edema; No deformity     ASSESSMENT AND PLAN: .    Assessment & Plan Coronary artery calcification  ASCVD risk   Aortic atherosclerosis CT Calcium  score with CAC of 513 places her in the 93rd percentile for her age, indicating a high risk of heart attack or stroke. She admits to significant life stress caring for her husband who has had 2 strokes and has Parkinson's.  She previously exercised regularly, but has not been exercising recently.  ASCVD risk calculator shows a 10% risk of heart attack or stroke in the next ten years without intervention.  BP is well-controlled.  She has been on simvastatin for years for management of hyperlipidemia.  She does not have history of diabetes or smoking.   - We will get  coronary CTA for ischemia evaluation -Take lopressor  50 mg two hours prior to the CT to lower heart rate -Stop simvastatin and start rosuvastatin  20 mg daily -Recommend starting a walking exercise routine and monitor for new symptoms such as chest pain -ER precautions advised -Continue heart healthy diet  Hyperlipidemia  LDL goal < 70 Lipid panel completed 08/16/23 with total cholesterol 206, HDL 58, triglycerides 131, LDL-C 126.  She has been on simvastatin for several years.  Advised goal LDL less than 70.  She is agreeable to take a more intense lipid-lowering therapy. -Stop simvastatin -Start rosuvastatin  20 mg once daily -Check fasting lipid panel, LP(a), ALT in 3 months -Healthy lifestyle recommended including heart healthy, mostly whole food diet and regular exercise  Hypertension BP is well controlled. Occasional morning throbbing headaches. She has not related these to high BP readings.  Blood pressures are sent to PCP for review via Pension scheme manager.  Kidney  function stable on labs completed 08/16/2023. - Continue antihypertensive therapy with amlodipine  and valsartan-hydrochlorothiazide Management per PCP   Plan/Goals: 1: Aim to stay physically active as well as aim for at least 150 minutes of moderate intensity exercise each week 2: Continue to eat a diet that is  mostly composed of whole foods limiting processed foods, saturated fat, sugar and other simple carbohydrates         Dispo: 3-4 months with me  Signed, Rosaline Bane, NP-C

## 2023-09-20 ENCOUNTER — Ambulatory Visit (HOSPITAL_BASED_OUTPATIENT_CLINIC_OR_DEPARTMENT_OTHER): Admitting: Nurse Practitioner

## 2023-09-20 ENCOUNTER — Encounter (HOSPITAL_BASED_OUTPATIENT_CLINIC_OR_DEPARTMENT_OTHER): Payer: Self-pay | Admitting: Nurse Practitioner

## 2023-09-20 VITALS — BP 138/64 | HR 86 | Ht 59.5 in | Wt 138.6 lb

## 2023-09-20 DIAGNOSIS — E785 Hyperlipidemia, unspecified: Secondary | ICD-10-CM

## 2023-09-20 DIAGNOSIS — Z79899 Other long term (current) drug therapy: Secondary | ICD-10-CM

## 2023-09-20 DIAGNOSIS — Z7189 Other specified counseling: Secondary | ICD-10-CM | POA: Diagnosis not present

## 2023-09-20 DIAGNOSIS — I10 Essential (primary) hypertension: Secondary | ICD-10-CM

## 2023-09-20 DIAGNOSIS — I700 Atherosclerosis of aorta: Secondary | ICD-10-CM

## 2023-09-20 DIAGNOSIS — I2510 Atherosclerotic heart disease of native coronary artery without angina pectoris: Secondary | ICD-10-CM

## 2023-09-20 MED ORDER — METOPROLOL TARTRATE 50 MG PO TABS: 50.0000 mg | ORAL_TABLET | Freq: Once | ORAL | 0 refills | Status: DC

## 2023-09-20 MED ORDER — ROSUVASTATIN CALCIUM 20 MG PO TABS: 20.0000 mg | ORAL_TABLET | Freq: Every day | ORAL | 1 refills | Status: DC

## 2023-09-20 NOTE — Patient Instructions (Signed)
 Medication Instructions:   STOP TAKING SIMVASTATIN NOW  START TAKING ROSUVASTATIN  (CRESTOR ) 20 MG BY  MOUTH DAILY  *If you need a refill on your cardiac medications before your next appointment, please call your pharmacy*   Lab Work:  IN 3 MONTHS HERE AT OUR [facility]--3RD FLOOR [address]--NMR LIPOPROFILE, LIPOPROTEIN A, AND ALT--PLEASE COME FASTING TO THIS LAB APPOINTMENT  If you have labs (blood work) drawn today and your tests are completely normal, you will receive your results only by: MyChart Message (if you have MyChart) OR A paper copy in the mail If you have any lab test that is abnormal or we need to change your treatment, we will call you to review the results.   Testing/Procedures:    Your cardiac CT will be scheduled at one of the below locations:   [facility] [address] [place], [place] [address] [phone]    If scheduled at [facility], please arrive at the [facility] and Children's Entrance (Entrance C2) of [facility] 30 minutes prior to test start time. You can use the FREE valet parking offered at entrance C (encouraged to control the heart rate for the test)  Proceed to the [facility] Radiology Department (first floor) to check-in and test prep.  All radiology patients and guests should use entrance C2 at [facility], accessed from [facility], even though the hospital's physical address listed is [address].    Please follow these instructions carefully (unless otherwise directed):  An IV will be required for this test and Nitroglycerin will be given.  Hold all erectile dysfunction medications at least 3 days (72 hrs) prior to test. (Ie viagra, cialis, sildenafil, tadalafil, etc)   On the Night Before the Test: Be sure to Drink plenty of water. Do not consume any caffeinated/decaffeinated beverages or chocolate 12 hours prior to your test. Do not take any antihistamines 12  hours prior to your test.  On the Day of the Test: Drink plenty of water until 1 hour prior to the test. Do not eat any food 1 hour prior to test. You may take your regular medications prior to the test.  Take metoprolol  (Lopressor ) 50 MG BY MOUTH two hours prior to test. If you take Hydrochlorothiazide  please HOLD on the morning of the test.  HOLD YOUR DIOVAN THE MORNING OF THIS TEST Patients who wear a continuous glucose monitor MUST remove the device prior to scanning. FEMALES- please wear underwire-free bra if available, avoid dresses & tight clothing       After the Test: Drink plenty of water. After receiving IV contrast, you may experience a mild flushed feeling. This is normal. On occasion, you may experience a mild rash up to 24 hours after the test. This is not dangerous. If this occurs, you can take Benadryl 25 mg, Zyrtec, Claritin, or Allegra and increase your fluid intake. (Patients taking Tikosyn should avoid Benadryl, and may take Zyrtec, Claritin, or Allegra) If you experience trouble breathing, this can be serious. If it is severe call 911 IMMEDIATELY. If it is mild, please call our office.  We will call to schedule your test 2-4 weeks out understanding that some insurance companies will need an authorization prior to the service being performed.   For more information and frequently asked questions, please visit our website : [email or web address]  For non-scheduling related questions, please contact the cardiac imaging nurse navigator should you have any questions/concerns: Cardiac Imaging Nurse Navigators  Direct Office Dial: [phone]   For scheduling needs, including cancellations and rescheduling, please call [place], [phone].   Follow-Up:  IN 3 MONTHS WITH ROSALINE BANE, NP IN LIPID CLINIC

## 2023-09-22 ENCOUNTER — Other Ambulatory Visit: Payer: Self-pay | Admitting: Surgery

## 2023-09-22 DIAGNOSIS — E042 Nontoxic multinodular goiter: Secondary | ICD-10-CM

## 2023-09-25 ENCOUNTER — Encounter (HOSPITAL_COMMUNITY): Payer: Self-pay

## 2023-09-25 ENCOUNTER — Emergency Department (HOSPITAL_COMMUNITY)
Admission: EM | Admit: 2023-09-25 | Discharge: 2023-09-25 | Disposition: A | Discharge status: Home / Self Care | Source: Ambulatory Visit | Attending: Emergency Medicine | Admitting: Emergency Medicine

## 2023-09-25 ENCOUNTER — Other Ambulatory Visit: Payer: Self-pay

## 2023-09-25 ENCOUNTER — Emergency Department (HOSPITAL_COMMUNITY)

## 2023-09-25 DIAGNOSIS — I10 Essential (primary) hypertension: Secondary | ICD-10-CM | POA: Diagnosis not present

## 2023-09-25 DIAGNOSIS — E049 Nontoxic goiter, unspecified: Secondary | ICD-10-CM | POA: Diagnosis not present

## 2023-09-25 DIAGNOSIS — I2510 Atherosclerotic heart disease of native coronary artery without angina pectoris: Secondary | ICD-10-CM | POA: Insufficient documentation

## 2023-09-25 DIAGNOSIS — Z79899 Other long term (current) drug therapy: Secondary | ICD-10-CM | POA: Insufficient documentation

## 2023-09-25 DIAGNOSIS — R002 Palpitations: Secondary | ICD-10-CM | POA: Diagnosis not present

## 2023-09-25 DIAGNOSIS — F419 Anxiety disorder, unspecified: Secondary | ICD-10-CM | POA: Diagnosis not present

## 2023-09-25 DIAGNOSIS — I700 Atherosclerosis of aorta: Secondary | ICD-10-CM | POA: Diagnosis not present

## 2023-09-25 DIAGNOSIS — R000 Tachycardia, unspecified: Secondary | ICD-10-CM | POA: Diagnosis not present

## 2023-09-25 DIAGNOSIS — E041 Nontoxic single thyroid nodule: Secondary | ICD-10-CM | POA: Diagnosis not present

## 2023-09-25 LAB — CBC
HCT: 40.4 % (ref 36.0–46.0)
Hemoglobin: 12.5 g/dL (ref 12.0–15.0)
MCH: 26.2 pg (ref 26.0–34.0)
MCHC: 30.9 g/dL (ref 30.0–36.0)
MCV: 84.7 fL (ref 80.0–100.0)
Platelets: 343 K/uL (ref 150–400)
RBC: 4.77 MIL/uL (ref 3.87–5.11)
RDW: 20.5 % — ABNORMAL HIGH (ref 11.5–15.5)
WBC: 10.3 K/uL (ref 4.0–10.5)
nRBC: 0 % (ref 0.0–0.2)

## 2023-09-25 LAB — BASIC METABOLIC PANEL WITH GFR
Anion gap: 13 (ref 5–15)
BUN: 18 mg/dL (ref 8–23)
CO2: 23 mmol/L (ref 22–32)
Calcium: 10.2 mg/dL (ref 8.9–10.3)
Chloride: 102 mmol/L (ref 98–111)
Creatinine, Ser: 0.86 mg/dL (ref 0.44–1.00)
GFR, Estimated: >60 mL/min (ref >60)
Glucose, Bld: 101 mg/dL — ABNORMAL HIGH (ref 70–99)
Potassium: 3.5 mmol/L (ref 3.5–5.1)
Sodium: 138 mmol/L (ref 135–145)

## 2023-09-25 LAB — TROPONIN I (HIGH SENSITIVITY): Troponin I (High Sensitivity): 5 ng/L (ref <18)

## 2023-09-25 LAB — TSH: TSH: 0.417 u[IU]/mL (ref 0.350–4.500)

## 2023-09-25 NOTE — ED Provider Notes (Incomplete)
 I provided a substantive portion of the care of this patient.  I personally made/approved the management plan for this patient and take responsibility for the patient management. {Remember to document shared critical care using edcritical dot phrase:1} EKG Interpretation Date/Time:  Monday September 25 2023 12:14:41 EDT Ventricular Rate:  73 PR Interval:  152 QRS Duration:  91 QT Interval:  395 QTC Calculation: 436 R Axis:   -13  Text Interpretation: Sinus rhythm no sig change from previous Confirmed by Armenta Canning [phone]) on 09/25/2023 12:33:21 PM

## 2023-09-25 NOTE — Discharge Instructions (Addendum)
 Please discuss these symptoms with your cardiologist, you may benefit from a heart monitor since your symptoms are intermittent in nature and not constant.  Return to the emergency department if your symptoms worsen or if you develop chest pain.  Follow-up with your primary care provider in regard to your thyroid  enlargement, your last thyroid  ultrasound was in June 2024 and it is recommended that you have this repeated in 1 year.

## 2023-09-25 NOTE — ED Provider Notes (Signed)
 [place] EMERGENCY DEPARTMENT AT [facility] Provider Note   CSN: [id number] Arrival date & time: 09/25/23  1154     Patient presents with: Tachycardia   Shelby Woodward is a 68 y.o. female.   68 year old female presenting with palpitations.  Patient was seen at urgent care this morning and told to come to the emergency department for further workup.  Patient tells me that she has been having palpitations for several months, like my heart is racing, however they have been worsening over the past 2 weeks.  She notices palpitations at rest on most days, she also describes a chest fullness, and she feels that she has been limiting her activity lately due to the symptoms.  She is not currently experiencing palpitations, but did experience symptoms this morning prior to presenting to urgent care.  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  She denies chest pain, shortness of breath, syncope/presyncope, lightheadedness/dizziness, lower extremity edema, abdominal pain/nausea/vomiting.  Patient was seen by her cardiologist on 8/20, has cardiac CT scheduled for this Wednesday.       Prior to Admission medications   Medication Sig Start Date End Date Taking? Authorizing Provider  ALPRAZolam (XANAX) 0.25 MG tablet Take 0.25 mg by mouth 2 (two) times daily as needed for anxiety.    [provider]  amLODipine  (NORVASC ) 10 MG tablet Take 10 mg by mouth daily.    [provider]  calcium  carbonate (SUPER CALCIUM ) 1500 (600 Ca) MG TABS tablet Take 1,500 mg by mouth in the morning and at bedtime. 02/19/21   [provider]  Cholecalciferol 50 MCG (2000 UT) CAPS Take 1 capsule by mouth daily at 6 (six) AM.    [provider]  esomeprazole (NEXIUM) 40 MG capsule Take 40 mg by mouth daily before breakfast.    [provider]  ferrous sulfate 325 (65 FE) MG EC tablet Take 325 mg by  mouth daily at 6 (six) AM.    [provider]  Magnesium 400 MG TABS Take 400 mg by mouth daily at 6 (six) AM. 01/03/18   [provider]  metoprolol  tartrate (LOPRESSOR ) 50 MG tablet Take 1 tablet (50 mg total) by mouth once for 1 dose. Take 90-120 minutes prior to scan. Hold for SBP less than 110. 09/20/23 09/20/23  Swinyer, Rosaline HERO, NP  Multiple Vitamins-Minerals (ONE A DAY WOMEN 50 PLUS PO) Take 1 tablet by mouth daily at 6 (six) AM.    [provider]  Potassium Chloride ER 20 MEQ TBCR Take 1 tablet by mouth daily.    [provider]  rosuvastatin  (CRESTOR ) 20 MG tablet Take 1 tablet (20 mg total) by mouth daily. 09/20/23   Swinyer, Rosaline HERO, NP  simethicone (MYLICON) 125 MG chewable tablet Chew 125 mg by mouth every 6 (six) hours as needed for flatulence.    [provider]  valsartan-hydrochlorothiazide (DIOVAN-HCT) 160-12.5 MG tablet Take 1 tablet by mouth daily.    [provider]    Allergies: Penicillins    Review of Systems  Updated Vital Signs  Vitals:   09/25/23 1219 09/25/23 1300 09/25/23 1315 09/25/23 1413  BP:  120/72  120/72  Pulse:  65 70 70  Resp:  16 (!) 23 16  Temp:      TempSrc:      SpO2:  98% 97% 100%  Weight: 62.9 kg     Height:  4' 11 (1.499 m)        Physical Exam Vitals and nursing note reviewed.  HENT:     Head: Normocephalic.  Eyes:     Extraocular Movements: Extraocular movements intact.  Cardiovascular:     Rate and Rhythm: Normal rate and regular rhythm.     Pulses:          Radial pulses are 2+ on the right side and 2+ on the left side.     Heart sounds: Normal heart sounds.  Pulmonary:     Effort: Pulmonary effort is normal.     Breath sounds: Normal breath sounds.  Musculoskeletal:     Right lower leg: No edema.     Left lower leg: No edema.     Comments: Moves all extremities spontaneously without difficulty  Skin:    General: Skin is warm and dry.  Neurological:     Mental  Status: She is alert and oriented to person, place, and time.     (all labs ordered are listed, but only abnormal results are displayed) Labs Reviewed  CBC - Abnormal; Notable for the following components:      Result Value   RDW 20.5 (*)    All other components within normal limits  BASIC METABOLIC PANEL WITH GFR - Abnormal; Notable for the following components:   Glucose, Bld 101 (*)    All other components within normal limits  TSH  TROPONIN I (HIGH SENSITIVITY)    EKG: EKG Interpretation Date/Time:  Monday September 25 2023 12:14:41 EDT Ventricular Rate:  73 PR Interval:  152 QRS Duration:  91 QT Interval:  395 QTC Calculation: 436 R Axis:   -13  Text Interpretation: Sinus rhythm no sig change from previous Confirmed by Armenta Canning [phone]) on 09/25/2023 12:33:21 PM  Radiology: DG Chest Portable 1 View Result Date: 09/25/2023 CLINICAL DATA:  Chest fullness, heart racing. EXAM: PORTABLE CHEST 1 VIEW COMPARISON:  None Available. FINDINGS: Trachea is deviated slightly to the right, suggesting left thyroid  enlargement. Heart size within normal limits. Thoracic aorta is calcified. Lungs are clear. No pleural fluid. IMPRESSION: 1. No acute findings. 2. Suspect left thyroid  enlargement. Previous thyroid  ultrasound 07/12/2022. One year follow-up thyroid  ultrasound is recommended at that time. Electronically Signed   By: Newell Eke M.D.   On: 09/25/2023 13:35     Procedures   Medications Ordered in the ED - No data to display                                  Medical Decision Making This patient presents to the ED for concern of palpitations/chest fullness, this involves an extensive number of treatment options, and is a complaint that carries with it a high risk of complications and morbidity.  The differential diagnosis includes ACS, stable versus unstable angina, dysrhythmia, electrolyte derangement, anemia.   Co morbidities that complicate the patient  evaluation  Hyperlipidemia, hypertension, CAD  Additional history obtained:  Additional history obtained from record review External records from outside source obtained and reviewed including urgent care note from today, cardiology note from last week   Lab Tests:  I Ordered, and personally interpreted labs.  The pertinent results include: CBC within normal limits.  BMP within normal limits.  Troponin 5. TSH pending at time of discharge.   Imaging Studies ordered:  I ordered imaging studies including CXR I independently visualized and interpreted imaging which showed 1. No  acute findings. 2. Suspect left thyroid  enlargement. Previous thyroid  ultrasound 07/12/2022. One year follow-up thyroid  ultrasound is recommended at that time.  I agree with the radiologist interpretation   Cardiac Monitoring: / EKG:  The patient was maintained on a cardiac monitor.  I personally viewed and interpreted the cardiac monitored which showed an underlying rhythm of: NSR   Problem List / ED Course / Critical interventions / Medication management I have reviewed the patients home medicines and have made adjustments as needed   Social Determinants of Health:  Depression, food insecurity   Test / Admission - Considered:  Physical exam is largely unremarkable, patient has a regular rate and rhythm with normal heart sounds, lungs are clear to auscultation bilaterally without adventitious sounds, she is in no acute distress.  Given symptoms have been ongoing for several months but worsening over the past 2 weeks with associated chest fullness, will proceed with basic labs and cardiac workup.  Patient is established with a cardiologist, she has a cardiac CT scheduled for Wednesday of this week.  She did not discuss these symptoms with her cardiologist last week, at urgent care today it was recommended that she discuss with us /her cardiologist about having a cardiac monitor to monitor her symptoms as  they are intermittent in nature. Labs are reassuring as above.  Patient tells me that because she is the primary caregiver for her husband, she needs to leave to relieve the caregiver who is watching him for her today.  At this time her TSH is pending and her second troponin has not been collected, however I have a very low suspicion for ACS at this time given that she is not having chest pain and her first troponin is within normal limits, I discussed this with her and I do feel that it is reasonable that she be discharged without checking a second troponin.  I advised patient to discuss heart monitor with her cardiologist, she voiced understanding and does have a scheduled cardiac CT with them this Wednesday.  Strict return precautions discussed, she is in agreement with this plan and is approved for discharge at this time.    Amount and/or Complexity of Data Reviewed Labs: ordered. Radiology: ordered.        Final diagnoses:  Palpitations  Thyroid  enlargement    ED Discharge Orders     None          Glendia Rocky SAILOR, [place] 09/25/23 1414    Armenta Canning, MD 09/26/23 [phone]

## 2023-09-25 NOTE — ED Triage Notes (Signed)
 Pt states for the past month she has been feeling like her heart has been racing. She also feels like chest fullness and head fog. Pt states she is having a lot of stress at home with taking care of her husband at home. Pt went to the UC today and they sent her here for evaluation.

## 2023-09-26 ENCOUNTER — Telehealth: Payer: Self-pay | Admitting: Nurse Practitioner

## 2023-09-26 ENCOUNTER — Telehealth (HOSPITAL_COMMUNITY): Payer: Self-pay | Admitting: Emergency Medicine

## 2023-09-26 NOTE — Telephone Encounter (Signed)
 Pt called in asking if Swinyer, NP can review her ED notes and order her a heart monitor. Not sure if she needs to be referred to gen card first. Please advise.

## 2023-09-26 NOTE — Telephone Encounter (Signed)
 Reaching out to patient to offer assistance regarding upcoming cardiac imaging study; pt verbalizes understanding of appt date/time, parking situation and where to check in, pre-test NPO status and medications ordered, and verified current allergies; name and call back number provided for further questions should they arise Rockwell Alexandria RN Navigator Cardiac Imaging [facility] [phone] office [phone] cell

## 2023-09-27 ENCOUNTER — Ambulatory Visit: Payer: Self-pay | Admitting: Internal Medicine

## 2023-09-27 ENCOUNTER — Ambulatory Visit (HOSPITAL_BASED_OUTPATIENT_CLINIC_OR_DEPARTMENT_OTHER)
Admission: RE | Admit: 2023-09-27 | Discharge: 2023-09-27 | Disposition: A | Discharge status: Home / Self Care | Source: Ambulatory Visit | Attending: Internal Medicine | Admitting: Internal Medicine

## 2023-09-27 ENCOUNTER — Other Ambulatory Visit: Payer: Self-pay | Admitting: Internal Medicine

## 2023-09-27 ENCOUNTER — Ambulatory Visit (HOSPITAL_COMMUNITY)
Admission: RE | Admit: 2023-09-27 | Discharge: 2023-09-27 | Disposition: A | Discharge status: Home / Self Care | Source: Ambulatory Visit | Attending: Nurse Practitioner | Admitting: Nurse Practitioner

## 2023-09-27 DIAGNOSIS — E785 Hyperlipidemia, unspecified: Secondary | ICD-10-CM | POA: Diagnosis not present

## 2023-09-27 DIAGNOSIS — I2510 Atherosclerotic heart disease of native coronary artery without angina pectoris: Secondary | ICD-10-CM

## 2023-09-27 DIAGNOSIS — I25119 Atherosclerotic heart disease of native coronary artery with unspecified angina pectoris: Secondary | ICD-10-CM | POA: Diagnosis not present

## 2023-09-27 DIAGNOSIS — R931 Abnormal findings on diagnostic imaging of heart and coronary circulation: Secondary | ICD-10-CM

## 2023-09-27 DIAGNOSIS — I700 Atherosclerosis of aorta: Secondary | ICD-10-CM | POA: Diagnosis not present

## 2023-09-27 MED ORDER — NITROGLYCERIN 0.4 MG SL SUBL
0.8000 mg | SUBLINGUAL_TABLET | Freq: Once | SUBLINGUAL | Status: AC
  Administered 2023-09-27: 0.8 mg via SUBLINGUAL

## 2023-09-27 MED ORDER — IOHEXOL 350 MG/ML SOLN
100.0000 mL | Freq: Once | INTRAVENOUS | Status: AC | PRN
  Administered 2023-09-27: 100 mL via INTRAVENOUS

## 2023-09-27 NOTE — Progress Notes (Signed)
CT sent for FFR 

## 2023-09-29 ENCOUNTER — Ambulatory Visit: Payer: Self-pay | Admitting: Nurse Practitioner

## 2023-10-01 DIAGNOSIS — E785 Hyperlipidemia, unspecified: Secondary | ICD-10-CM | POA: Diagnosis not present

## 2023-10-01 DIAGNOSIS — F331 Major depressive disorder, recurrent, moderate: Secondary | ICD-10-CM | POA: Diagnosis not present

## 2023-10-01 DIAGNOSIS — I10 Essential (primary) hypertension: Secondary | ICD-10-CM | POA: Diagnosis not present

## 2023-10-01 DIAGNOSIS — F330 Major depressive disorder, recurrent, mild: Secondary | ICD-10-CM | POA: Diagnosis not present

## 2023-10-04 ENCOUNTER — Telehealth (HOSPITAL_BASED_OUTPATIENT_CLINIC_OR_DEPARTMENT_OTHER): Payer: Self-pay | Admitting: Nurse Practitioner

## 2023-10-04 DIAGNOSIS — E042 Nontoxic multinodular goiter: Secondary | ICD-10-CM | POA: Diagnosis not present

## 2023-10-04 MED ORDER — METOPROLOL SUCCINATE ER 25 MG PO TB24: 25.0000 mg | ORAL_TABLET | Freq: Every day | ORAL | 2 refills | Status: AC

## 2023-10-04 MED ORDER — ASPIRIN 81 MG PO TBEC
81.0000 mg | DELAYED_RELEASE_TABLET | Freq: Every day | ORAL | Status: AC
Start: 2023-10-04 — End: ?

## 2023-10-04 NOTE — Telephone Encounter (Signed)
 CT CORONARY MORPH W/CTA COR W/SCORE W/CA W/CM &/OR WO/CM: Patient Agricultural engineer   Add Notifications    Ms. Krahenbuhl, your CT shows mild to moderate coronary artery disease. There is an area of tapering stenosis in the middle to distal part of your left anterior descending are but the flow through that artery does not appear to be significantly abnormal. We want to ensure that you are feeling well and are on appropriate medical therapy. Goal for LDL (bad) cholesterol is 70 or lower. We have changed you to a more potent statin to help with that. Would recommend that you continue amlodipine  and start aspirin  81 mg daily. We can also consider starting you on metoprolol  which is a medication that helps your heart not work as hard and can lessen palpitations (I see you were in the ED for palpitations). If you want to start metoprolol , I would recommend that we start you on Toprol  XL 25 mg at nighttime. Otherwise, continue to aim for lifestyle modification to eat a healthy diet avoiding processed foods, sugar and high sodium meals, and aim for a mostly whole food diet. Also, try to get some regular exercise at least 30 minutes daily. Please let me know if you have any questions.   Written by Rosaline CHRISTELLA Bane, NP on 09/29/2023 10:36 AM EDT    The patient has been notified of the result and verbalized understanding.  All questions (if any) were answered.  Pt aware to continue taking her crestor  20 mg po daily and ASA 81 mg po daily.  Advised the pt to continue her current dose of amlodipine .   Pt aware we will start her on Toprol  XL 25 mg po daily at bedtime.    Advised her to continue with healthy lifestyle and 30 mins of exercise daily.  Pt education provided about the mediterranean diet and other dietary recommendations as outlined in this result by Rosaline Bane, NP.   Advised the pt to proceed with planned repeat labs in 3 months, as outlined at last OV with Rosaline Bane, NP.    Confirmed the pharmacy of choice with the pt.   Pt verbalized understanding and agrees with this plan.

## 2023-10-04 NOTE — Telephone Encounter (Signed)
 Patient stopped by desk at [facility] re her recent CT. Per patient, she does not check her MyChart messages due to a bad internet connection at home so this Clinical research associate printed the message from Rosaline Bane, NP dated 09/29/23. Patient would like a call from a nurse to discuss the implications of the recommendations and to talk about next steps.  Patient would like to start the metoprolol  so please call this medication to the [facility] pharmacy on [address].

## 2023-10-05 ENCOUNTER — Telehealth: Payer: Self-pay | Admitting: Internal Medicine

## 2023-10-05 NOTE — Telephone Encounter (Signed)
 Spoke with patient. She received Rx for metoprolol  succinate at night. Pharmacist told her this med can lower BP and she is concerned her BP may drop too low. She asked about taking it PRN.   She has a BP cuff thru PCP that transmits to their office. She also reports her cuff is too large so maybe in accurate readings. She is supposed to be getting a new one.   Explained results and that EMERSON Bane, NP indicated beta blocker would be at her discretion. She is asking about a PRN instead.   Will defer to M. Swinyer NP  See excerpt: We can also consider starting you on metoprolol  which is a medication that helps your heart not work as hard and can lessen palpitations (I see you were in the ED for palpitations). If you want to start metoprolol , I would recommend that we start you on Toprol  XL 25 mg at nighttime.

## 2023-10-05 NOTE — Telephone Encounter (Signed)
 Tried to call patient x 3, call can not be completed

## 2023-10-05 NOTE — Telephone Encounter (Signed)
 Metoprolol  will not likely significantly reduce her blood pressure. She can take it as needed if she prefers, it is long acting, so if she finds that she is having frequent palpitations and wants to take the medication for those symptoms, we should consider changing it to metoprolol  tartrate 25 mg every 12 hours as needed.

## 2023-10-05 NOTE — Telephone Encounter (Signed)
 Pt c/o medication issue:  1. Name of Medication:   metoprolol  succinate (TOPROL  XL) 25 MG 24 hr tablet   2. How are you currently taking this medication (dosage and times per day)?   Not started yet  3. Are you having a reaction (difficulty breathing--STAT)?   4. What is your medication issue?   Patient stated she picked up this medication today and wants to know if this medication can be taken on an as needed basis.

## 2023-10-06 NOTE — Telephone Encounter (Signed)
 Returned a call back to the pt and went over recommendations per Rosaline Bane, NP.   Pt states she wants to stay on this regimen and take the Toprol  XL 25 mg po daily at bedtime like Rosaline originally advised her to.  She states she will monitor her numbers and take it as needed, if she prefers.   She is aware to let us  know if she develops frequent palpitations, for we could consider changing it to metoprolol  tartrate 25 mg q12hrs as needed thereafter.   Pt verbalized understanding and agrees with this plan. Pt was more than gracious for all the assistance provided.

## 2023-10-12 DIAGNOSIS — Z23 Encounter for immunization: Secondary | ICD-10-CM | POA: Diagnosis not present

## 2023-10-17 ENCOUNTER — Ambulatory Visit
Admission: RE | Admit: 2023-10-17 | Discharge: 2023-10-17 | Disposition: A | Discharge status: Home / Self Care | Source: Ambulatory Visit | Attending: Surgery | Admitting: Surgery

## 2023-10-17 DIAGNOSIS — E042 Nontoxic multinodular goiter: Secondary | ICD-10-CM | POA: Diagnosis not present

## 2023-10-23 DIAGNOSIS — D649 Anemia, unspecified: Secondary | ICD-10-CM | POA: Diagnosis not present

## 2023-10-23 DIAGNOSIS — K317 Polyp of stomach and duodenum: Secondary | ICD-10-CM | POA: Diagnosis not present

## 2023-10-23 DIAGNOSIS — Z8619 Personal history of other infectious and parasitic diseases: Secondary | ICD-10-CM | POA: Diagnosis not present

## 2023-10-26 DIAGNOSIS — L2989 Other pruritus: Secondary | ICD-10-CM | POA: Diagnosis not present

## 2023-10-26 DIAGNOSIS — L853 Xerosis cutis: Secondary | ICD-10-CM | POA: Diagnosis not present

## 2023-10-26 DIAGNOSIS — L280 Lichen simplex chronicus: Secondary | ICD-10-CM | POA: Diagnosis not present

## 2023-10-30 DIAGNOSIS — I10 Essential (primary) hypertension: Secondary | ICD-10-CM | POA: Diagnosis not present

## 2023-10-31 DIAGNOSIS — I10 Essential (primary) hypertension: Secondary | ICD-10-CM | POA: Diagnosis not present

## 2023-10-31 DIAGNOSIS — E785 Hyperlipidemia, unspecified: Secondary | ICD-10-CM | POA: Diagnosis not present

## 2023-10-31 DIAGNOSIS — F330 Major depressive disorder, recurrent, mild: Secondary | ICD-10-CM | POA: Diagnosis not present

## 2023-10-31 DIAGNOSIS — F331 Major depressive disorder, recurrent, moderate: Secondary | ICD-10-CM | POA: Diagnosis not present

## 2023-11-20 DIAGNOSIS — M2011 Hallux valgus (acquired), right foot: Secondary | ICD-10-CM | POA: Diagnosis not present

## 2023-11-20 DIAGNOSIS — M79672 Pain in left foot: Secondary | ICD-10-CM | POA: Diagnosis not present

## 2023-11-20 DIAGNOSIS — M65961 Unspecified synovitis and tenosynovitis, right lower leg: Secondary | ICD-10-CM | POA: Diagnosis not present

## 2023-12-01 DIAGNOSIS — F330 Major depressive disorder, recurrent, mild: Secondary | ICD-10-CM | POA: Diagnosis not present

## 2023-12-01 DIAGNOSIS — F331 Major depressive disorder, recurrent, moderate: Secondary | ICD-10-CM | POA: Diagnosis not present

## 2023-12-01 DIAGNOSIS — E785 Hyperlipidemia, unspecified: Secondary | ICD-10-CM | POA: Diagnosis not present

## 2023-12-01 DIAGNOSIS — I10 Essential (primary) hypertension: Secondary | ICD-10-CM | POA: Diagnosis not present

## 2023-12-20 ENCOUNTER — Encounter (HOSPITAL_BASED_OUTPATIENT_CLINIC_OR_DEPARTMENT_OTHER): Admitting: Nurse Practitioner

## 2023-12-31 DIAGNOSIS — E785 Hyperlipidemia, unspecified: Secondary | ICD-10-CM | POA: Diagnosis not present

## 2023-12-31 DIAGNOSIS — F331 Major depressive disorder, recurrent, moderate: Secondary | ICD-10-CM | POA: Diagnosis not present

## 2023-12-31 DIAGNOSIS — F330 Major depressive disorder, recurrent, mild: Secondary | ICD-10-CM | POA: Diagnosis not present

## 2023-12-31 DIAGNOSIS — I10 Essential (primary) hypertension: Secondary | ICD-10-CM | POA: Diagnosis not present

## 2024-01-10 DIAGNOSIS — E042 Nontoxic multinodular goiter: Secondary | ICD-10-CM | POA: Diagnosis not present

## 2024-01-25 LAB — NMR, LIPOPROFILE
Cholesterol, Total: 178 mg/dL (ref 100–199)
HDL Particle Number: 45.8 umol/L
HDL-C: 67 mg/dL
LDL Particle Number: 1182 nmol/L — ABNORMAL HIGH
LDL Size: 20.9 nm
LDL-C (NIH Calc): 89 mg/dL (ref 0–99)
LP-IR Score: 46 — ABNORMAL HIGH
Small LDL Particle Number: 479 nmol/L
Triglycerides: 125 mg/dL (ref 0–149)

## 2024-01-25 LAB — LIPOPROTEIN A (LPA): Lipoprotein (a): 135.9 nmol/L — ABNORMAL HIGH

## 2024-01-25 LAB — ALT: ALT: 25 IU/L (ref 0–32)

## 2024-01-31 ENCOUNTER — Telehealth: Payer: Self-pay | Admitting: Pharmacy Technician

## 2024-01-31 ENCOUNTER — Other Ambulatory Visit (HOSPITAL_BASED_OUTPATIENT_CLINIC_OR_DEPARTMENT_OTHER): Payer: Self-pay | Admitting: *Deleted

## 2024-01-31 ENCOUNTER — Encounter (HOSPITAL_BASED_OUTPATIENT_CLINIC_OR_DEPARTMENT_OTHER): Payer: Self-pay | Admitting: Nurse Practitioner

## 2024-01-31 ENCOUNTER — Other Ambulatory Visit (HOSPITAL_COMMUNITY): Payer: Self-pay

## 2024-01-31 ENCOUNTER — Ambulatory Visit (HOSPITAL_BASED_OUTPATIENT_CLINIC_OR_DEPARTMENT_OTHER): Admitting: Nurse Practitioner

## 2024-01-31 VITALS — BP 126/78 | HR 69 | Ht 59.0 in | Wt 138.7 lb

## 2024-01-31 DIAGNOSIS — R002 Palpitations: Secondary | ICD-10-CM | POA: Diagnosis not present

## 2024-01-31 DIAGNOSIS — I2510 Atherosclerotic heart disease of native coronary artery without angina pectoris: Secondary | ICD-10-CM

## 2024-01-31 DIAGNOSIS — I10 Essential (primary) hypertension: Secondary | ICD-10-CM

## 2024-01-31 DIAGNOSIS — I700 Atherosclerosis of aorta: Secondary | ICD-10-CM

## 2024-01-31 DIAGNOSIS — E785 Hyperlipidemia, unspecified: Secondary | ICD-10-CM

## 2024-01-31 DIAGNOSIS — Z7189 Other specified counseling: Secondary | ICD-10-CM

## 2024-01-31 DIAGNOSIS — E7841 Elevated Lipoprotein(a): Secondary | ICD-10-CM | POA: Diagnosis not present

## 2024-01-31 MED ORDER — REPATHA SURECLICK 140 MG/ML ~~LOC~~ SOAJ: 140.0000 mg | SUBCUTANEOUS | 3 refills | Status: AC

## 2024-01-31 NOTE — Telephone Encounter (Signed)
 Pharmacy Patient Advocate Encounter  Received notification from [employer] that Prior Authorization for Repatha has been APPROVED from 01/31/24 to 07/29/24. Ran test claim, Copay is $47.00-  one month. This test claim was processed through [facility]- copay amounts may vary at other pharmacies due to pharmacy/plan contracts, or as the patient moves through the different stages of their insurance plan.   PA #/Case ID/Reference #: [id number]

## 2024-01-31 NOTE — Progress Notes (Signed)
 "   Cardiology Office Note:  .   Date:  01/31/2024 ID:  Roshell, Shelby Woodward [birth year], MRN [id number] PCP: Kip Righter, MD [facility] Providers Cardiologist:  None   Patient Profile: .      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 Calcium  score is 513, with calcification in three of the four major heart arteries, predominantly in the right coronary artery. She is concerned about her cardiovascular risk and the safety of exercising with this profile. She admits to significant life stress caring for her husband who has had 2 strokes and has Parkinson's.  She has help a few hours a day but generally feels overwhelmed.  They used to exercise together on a regular basis but she reports she does not have time to exercise currently.  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  She often wakes up with throbbing in her head.  She monitors BP at home, but not particularly at times when she has headache. Pain is generally relieved by aspirin . She is active with caring for her husband. No chest pain, shortness of breath, palpitations, orthopnea, PND, edema, presyncope, syncope. She adheres to a healthy diet, avoiding fried foods, processed food, and limiting red meat. Family history is significant for cardiovascular disease. Her mother had three heart attacks and a heart transplant, and her father had a cerebral hemorrhage. She has three sisters, one with severe diabetes and another with high blood pressure. She generally feels  exhausted but would like to start exercising. ASCVD risk score 10%.  LDL not well-controlled at 126, on simvastatin for years.  She was switched to rosuvastatin  20 mg daily.  Aspirin  81 mg daily was started.  Coronary CTA 09/27/23 revealed mild to moderate CAD with extensive total plaque volume but no discrete stenosis by CT FFR.  There is tapering of mid to distal LAD that did not appear to be significant.  She was advised to start Toprol -XL 25 mg at bedtime due to palpitations.  Lipoprotein a elevated at 135.9. NMR 01/23/24 revealed LDL particle number [id number], LDL-C 89, HDL-C 67, triglycerides 125, total cholesterol 178, small LDL-P 479.     History of Present Illness: .    Discussed the use of AI scribe software for clinical note transcription with the patient, who gave verbal consent to proceed.  History of Present Illness Shelby Woodward is a very pleasant 68 year old female who is here for follow-up of coronary artery disease.  She reports she is overall feeling well but is under significant stress caring for her husband whose health is failing.  She is seeking a support group.  She manages her cholesterol with rosuvastatin  20 mg daily which she is tolerating without concerning side effect.  Her LDL has decreased from 126 to 89 mg/dL, but lipoprotein(a) and small dense LDL remain elevated.  We discussed the implications diet can have on cholesterol levels.  She generally eats a healthy diet but does admit to a fondness for ice cream and cheese. Her coronary CT showed widespread mixed soft and calcified plaque in  the right coronary, left anterior descending, and left circumflex arteries. She has had no recent chest pain, shortness of breath, palpitations, or lightheadedness. She takes metoprolol  in the evening for prior palpitations, with good control. She focuses on home-cooked meals with lean meats and olive oil and is motivated to maintain a heart-healthy diet, occasionally using small simple meals when  her appetite is low.    Lipoprotein (a)  Date/Time Value Ref Range Status  01/23/2024 10:37 AM 135.9 (H) <75.0 nmol/L Final    Comment:    Note:  Values greater than or equal to 75.0 nmol/L may        indicate an independent risk factor for CHD,        but must be evaluated with caution when applied        to non-Caucasian populations due to the        influence of genetic factors on Lp(a) across        ethnicities.       ROS: See HPI      Studies Reviewed: .         CT Calcium  Score 05/01/23 Non-cardiac: No significant non cardiac findings on limited lung and soft tissue windows. See separate report from [facility].   Ascending Aorta: Normal diameter 3.3 cm   Pericardium: Normal   Coronary arteries: 3 vessel coronary calcium  noted   LM 0   LAD 161   RCA 348   LCX 3.97   Total 513   IMPRESSION: Coronary calcium  score of 513. This was 40 rd percentile for age and sex matched control.  Coronary CTA 09/27/23 IMPRESSION: 1. Mild to moderate mixed CAD, CADRADS = 3. CT FFR will be performed and reported separately.   2. Normal coronary origin with right dominance.   3. Coronary artery calcium  score is 1739, which places the patient in the 99th percentile for age/race and sex-matched controls (MESA).   4. Total plaque volume 1696 mm3, of which 195 mm3 (11%) is calcified plaque and 1517mm3 (89%) is non-calcified plaque. TPV is extensive.   5. Aggressive risk factor modification and lipid lowering recommended to achieve target LDL <55. Consider evaluation of inflammatory markers and treatment if indicated.  1. CT FFR analysis did not show any significant discrete stenosis (<0.75). There was a tapering stenosis of the mid to distal LAD of unknown significance, but it does not likely represent a discrete lesion.   2. Clinical correlation with anginal symptoms advised. Aggressive medical therapy is recommended.    Risk Assessment/Calculations:              Physical Exam:   VS: BP 126/78 (BP Location: Right Arm, Patient Position: Sitting, Cuff Size: Large)   Pulse 69   Ht 4' 11 (1.499 m)   Wt 138 lb 11.2 oz (62.9 kg)   SpO2 98%   BMI 28.01 kg/m   Wt Readings from Last 3 Encounters:  01/31/24 138 lb 11.2 oz (62.9 kg)  09/25/23 138 lb 10.7 oz (62.9 kg)  09/20/23 138 lb 9.6 oz (62.9 kg)     GEN: Well nourished, well developed in no acute distress NECK: No JVD; No carotid bruits CARDIAC: RRR, no murmurs, rubs, gallops RESPIRATORY:  Clear to auscultation without rales, wheezing or rhonchi  ABDOMEN: Soft, non-tender, non-distended EXTREMITIES:  No edema; No deformity     ASSESSMENT AND PLAN: .    Assessment & Plan Coronary artery disease Aortic atherosclerosis Cardiac risk She initially presented for evaluation following elevated CT  calcium  score.  Coronary CTA 09/27/2023 revealed mild to moderate mixed CAD with extensive total plaque volume and CT FFR that showed tapering stenosis of mid to distal LAD not likely representative of discrete lesion.  She was advised to start metoprolol  25 mg daily.  She denies chest pain, dyspnea, or other symptoms concerning for angina.  No indication for further ischemic evaluation at this time. BP is well-controlled.  We do lengthy discussion about goals for lipids.  She has had good improvement since switching from simvastatin to rosuvastatin , however unfortunately despite healthy diet, LDL remains above goal at 89 and small LDL particle number is > 400.  She also has elevated lipoprotein a. No history of diabetes or smoking. Given that she is asymptomatic for concerning cardiac symptoms, we will continue with guideline directed medical therapy. - Continue aspirin , valsartan-hydrochlorothiazide, metoprolol , amlodipine  - Additional lipid-lowering therapy as noted below - Heart healthy diet avoiding processed foods, saturated fat, sugar, and other simple carbohydrates encouraged - Be as physically  active as possible every day and aim for at least 150 minutes of moderate intensity exercise each week  Hyperlipidemia  LDL goal < 55 Elevated lipoprotein a NMR LipoProfile 01/23/2024 with LDL particle #[id number], LDL-C 89, HDL-C 67, triglycerides 125, total cholesterol 178, and small LDL 479.  She is tolerating rosuvastatin  20 mg daily without concerning side effects.  We discussed these improvements, however with significant total plaque volume on CCTA and elevated LP(a), goal LDL is 55 or lower.  She generally eats a healthy diet but will also work on limiting cheese and ice cream which she admits a fondness for. Recommend additional lipid-lowering therapy which we discussed options and potential side effects in detail.  She would like to proceed with PCSK9 inhibitor as long as it is cost effective. - Continue rosuvastatin  20 mg once daily - Start PCSK9 inhibitor therapy, will await insurance approval - Check fasting lipid panel after 2-3 months of therapy  - Continue heart healthy diet avoiding saturated fat, processed foods, sugar, and other simple carbohydrates - Be as physically active as possible every day and aim for at least 150 minutes of moderate intensity exercise each week  Hypertension BP is well controlled. Blood pressures are sent to PCP for review via Pension scheme manager.  Kidney function stable on labs completed 09/25/23.no change in antihypertensive therapy today.   - Continue amlodipine , metoprolol , and valsartan-hydrochlorothiazide  Palpitations Palpitations are well-controlled with metoprolol , with no recent episodes.  - Continue metoprolol            Dispo: 1 year with me  Signed, Rosaline Bane, NP-C "

## 2024-01-31 NOTE — Telephone Encounter (Signed)
 Pharmacy Patient Advocate Encounter   Received notification from Physician's Office that prior authorization for repatha is required/requested.   Insurance verification completed.   The patient is insured through [facility] [employer]/RX ADVANCE.   Per test claim: PA required; PA submitted to above mentioned insurance via Latent Key/confirmation #/EOC [id number] Status is pending

## 2024-01-31 NOTE — Patient Instructions (Signed)
 Medication Instructions:   Your physician recommends that you continue on your current medications as directed. Please refer to the Current Medication list given to you today.   *If you need a refill on your cardiac medications before your next appointment, please call your pharmacy*  Lab Work:  Your physician recommends that you return for a FASTING NMR, fasting after midnight, at any [facility]. After 4-6 doses of PCSK9. Patient given paperwork today.    If you have labs (blood work) drawn today and your tests are completely normal, you will receive your results only by: MyChart Message (if you have MyChart) OR A paper copy in the mail If you have any lab test that is abnormal or we need to change your treatment, we will call you to review the results.  Testing/Procedures:  None ordered.   Follow-Up: At [facility], you and your health needs are our priority.  As part of our continuing mission to provide you with exceptional heart care, our providers are all part of one team.  This team includes your primary Cardiologist (physician) and Advanced Practice Providers or APPs (Physician Assistants and Nurse Practitioners) who all work together to provide you with the care you need, when you need it.  Your next appointment:   1 year(s)  Provider:   Rosaline Bane, NP    We recommend signing up for the patient portal called MyChart.  Sign up information is provided on this After Visit Summary.  MyChart is used to connect with patients for Virtual Visits (Telemedicine).  Patients are able to view lab/test results, encounter notes, upcoming appointments, etc.  Non-urgent messages can be sent to your provider as well.   To learn more about what you can do with MyChart, go to [email or web address].   Other Instructions  Your physician wants you to follow-up in: 1 year. You will receive a reminder letter in the mail two months in advance. If you don't receive a letter, please  call our office to schedule the follow-up appointment.  Evolocumab Injection What is this medication? EVOLOCUMAB (e voe LOK ue mab) treats high cholesterol. It may also be used to lower the risk of heart attack, stroke, worsening chest pain (unstable angina), and a type of heart surgery. It works by decreasing bad cholesterol (such as LDL) in your blood. It is a monoclonal antibody. Changes to diet and exercise are often combined with this medication. This medicine may be used for other purposes; ask your health care provider or pharmacist if you have questions. COMMON BRAND NAME(S): Repatha, Repatha SureClick What should I tell my care team before I take this medication? They need to know if you have any of these conditions: An unusual or allergic reaction to evolocumab, latex, other medications, foods, dyes, or preservatives Pregnant or trying to get pregnant Breast-feeding How should I use this medication? This medication is injected under the skin. You will be taught how to prepare and give it. Take it as directed on the prescription label at the same time every day. Keep taking it unless your care team tells you to stop. It is important that you put your used needles and syringes in a special sharps container. Do not put them in a trash can. If you do not have a sharps container, call your pharmacist or care team to get one. This medication comes with INSTRUCTIONS FOR USE. Ask your pharmacist for directions on how to use this medication. Read the information carefully. Talk to your pharmacist or care  team if you have questions. Talk to your care team about the use of this medication in children. While it may be prescribed for children as young as 10 years for selected conditions, precautions do apply. Overdosage: If you think you have taken too much of this medicine contact a poison control center or emergency room at once. NOTE: This medicine is only for you. Do not share this medicine with  others. What if I miss a dose? It is important not to miss any doses. Talk to your care team about what to do if you miss a dose. What may interact with this medication? Interactions are not expected. This list may not describe all possible interactions. Give your health care provider a list of all the medicines, herbs, non-prescription drugs, or dietary supplements you use. Also tell them if you smoke, drink alcohol, or use illegal drugs. Some items may interact with your medicine. What should I watch for while using this medication? Visit your care team for regular checks on your progress. Tell your care team if your symptoms do not start to get better or if they get worse. You may need blood work while you are taking this medication. Do not wear the on-body infuser during an MRI. Taking this medication is only part of a total heart healthy program. Ask your care team if there are other changes you can make to improve your overall health. What side effects may I notice from receiving this medication? Side effects that you should report to your care team as soon as possible: Allergic reactions or angioedema--skin rash, itching or hives, swelling of the face, eyes, lips, tongue, arms, or legs, trouble swallowing or breathing Side effects that usually do not require medical attention (report to your care team if they continue or are bothersome): Back pain Flu-like symptoms--fever, chills, muscle pain, cough, headache, fatigue Pain, redness, or irritation at injection site Runny or stuffy nose Sore throat This list may not describe all possible side effects. Call your doctor for medical advice about side effects. You may report side effects to FDA at 1-800-FDA-1088. Where should I keep my medication? Keep out of the reach of children and pets. Store in a refrigerator or at room temperature between 20 and 25 degrees C (68 and 77 degrees F). Refrigeration (preferred): Store it in the refrigerator.  Do not freeze. Keep it in the original carton until you are ready to take it. Remove the dose from the carton about 30 minutes before it is time for you to take it. Get rid of any unused medication after the expiration date. Room temperature: This medication may be stored at room temperature for up to 30 days. Keep it in the original carton until you are ready to take it. If it is stored at room temperature, get rid of any unused medication after 30 days or after it expires, whichever is first. Protect from light. Do not shake. Avoid exposure to extreme heat. To get rid of medications that are no longer needed or have expired: Take the medication to a medication take-back program. Check with your pharmacy or law enforcement to find a location. If you cannot return the medication, ask your pharmacist or care team how to get rid of this medication safely. NOTE: This sheet is a summary. It may not cover all possible information. If you have questions about this medicine, talk to your doctor, pharmacist, or health care provider.  2025 Elsevier/Gold Standard (2023-03-06 00:00:00)  Adopting a Healthy Lifestyle.  Weight: Know what a healthy weight is for you (roughly BMI <25) and aim to maintain this. You can calculate your body mass index on your smart phone. Unfortunately, this is not the most accurate measure of healthy weight, but it is the simplest measurement to use. A more accurate measurement involves body scanning which measures lean muscle, fat tissue and bony density. We do not have this equipment at [facility].    Diet: Aim for 7+ servings of fruits and vegetables daily Limit animal fats in diet for cholesterol and heart health - choose grass fed whenever available Avoid highly processed foods (fast food burgers, tacos, fried chicken, pizza, hot dogs, french fries)  Saturated fat comes in the form of butter, lard, coconut oil, margarine, partially hydrogenated oils, dairy products, and fat in  meat. These increase your risk of cardiovascular disease.  Use healthy plant oils, such as olive, canola, soy, corn, sunflower and peanut.  Whole foods such as fruits, vegetables and whole grains have fiber  Men need > 38 grams of fiber per day Women need > 25 grams of fiber per day  Load up on vegetables and fruits - one-half of your plate: Aim for color and variety, and remember that potatoes dont count. Go for whole grains - one-quarter of your plate: Whole wheat, barley, wheat berries, quinoa, oats, brown rice, and foods made with them. If you want pasta, go with whole wheat pasta. Protein power - one-quarter of your plate: Fish, chicken, beans, and nuts are all healthy, versatile protein sources. Limit red meat. You need carbohydrates for energy! The type of carbohydrate is more important than the amount. Choose carbohydrates such as vegetables, fruits, whole grains, beans, and nuts in the place of white rice, white pasta, potatoes (baked or fried), macaroni and cheese, cakes, cookies, and donuts.  If youre thirsty, drink water. Coffee and tea are good in moderation, but skip sugary drinks and limit milk and dairy products to one or two daily servings. Keep sugar intake at 6 teaspoons or 24 grams or LESS       Exercise: Aim for 150 min of moderate intensity exercise weekly for heart health, and weights twice weekly for bone health Stay active - any steps are better than no steps! Aim for 7-9 hours of sleep daily   Sleep: This provides your body with the reset and relaxation that it needs!  Aim to get 7-8 hours of sleep each night. Limit caffeine, screen time, and other distractions prior to bedtime.  Keep your bedroom cool and dark and do not wear heavy clothing to bed or use heavy bed covers - layer if needed.

## 2024-01-31 NOTE — Telephone Encounter (Signed)
 S/w pt is aware Repatha has been approved for 47.00 monthly.  Sent in to requested pharmacy.  Pt was given paperwork today for fasting labs after 4-6 injections.

## 2024-02-02 ENCOUNTER — Ambulatory Visit

## 2024-02-15 ENCOUNTER — Other Ambulatory Visit (HOSPITAL_BASED_OUTPATIENT_CLINIC_OR_DEPARTMENT_OTHER): Payer: Self-pay | Admitting: Family Medicine

## 2024-02-15 DIAGNOSIS — M8580 Other specified disorders of bone density and structure, unspecified site: Secondary | ICD-10-CM

## 2024-02-23 ENCOUNTER — Other Ambulatory Visit: Payer: Self-pay | Admitting: Family Medicine

## 2024-02-23 ENCOUNTER — Ambulatory Visit
Admission: RE | Admit: 2024-02-23 | Discharge: 2024-02-23 | Disposition: A | Source: Ambulatory Visit | Attending: Family Medicine | Admitting: Family Medicine

## 2024-02-23 DIAGNOSIS — Z1231 Encounter for screening mammogram for malignant neoplasm of breast: Secondary | ICD-10-CM

## 2024-03-02 ENCOUNTER — Other Ambulatory Visit (HOSPITAL_BASED_OUTPATIENT_CLINIC_OR_DEPARTMENT_OTHER): Payer: Self-pay | Admitting: Nurse Practitioner

## 2024-03-06 NOTE — Telephone Encounter (Signed)
 In accordance with refill protocols, please review and address the following requirements before this medication refill can be authorized:  Labs
